# Patient Record
Sex: Female | Born: 1955 | Race: Black or African American | Hispanic: No | Marital: Married | Smoking: Current every day smoker
Health system: Southern US, Community
[De-identification: ages and names within clinical notes are randomized; demographics above are authoritative.]

## PROBLEM LIST (undated history)

## (undated) DIAGNOSIS — N920 Excessive and frequent menstruation with regular cycle: Secondary | ICD-10-CM

## (undated) HISTORY — DX: Excessive and frequent menstruation with regular cycle: N92.0

## (undated) HISTORY — PX: WISDOM TOOTH EXTRACTION: SHX21

---

## 1997-06-15 ENCOUNTER — Ambulatory Visit (HOSPITAL_COMMUNITY): Admission: RE | Admit: 1997-06-15 | Discharge: 1997-06-15 | Payer: Self-pay | Admitting: *Deleted

## 1997-10-29 ENCOUNTER — Inpatient Hospital Stay (HOSPITAL_COMMUNITY): Admission: AD | Admit: 1997-10-29 | Discharge: 1997-10-31 | Payer: Self-pay

## 1997-12-11 ENCOUNTER — Encounter (HOSPITAL_COMMUNITY): Admission: RE | Admit: 1997-12-11 | Discharge: 1998-03-11 | Payer: Self-pay | Admitting: Obstetrics & Gynecology

## 1997-12-21 ENCOUNTER — Encounter: Admission: RE | Admit: 1997-12-21 | Discharge: 1997-12-21 | Payer: Self-pay | Admitting: Obstetrics & Gynecology

## 1997-12-21 ENCOUNTER — Other Ambulatory Visit: Admission: RE | Admit: 1997-12-21 | Discharge: 1997-12-21 | Payer: Self-pay | Admitting: Obstetrics & Gynecology

## 1997-12-21 ENCOUNTER — Ambulatory Visit (HOSPITAL_COMMUNITY): Admission: RE | Admit: 1997-12-21 | Discharge: 1997-12-21 | Payer: Self-pay | Admitting: Obstetrics & Gynecology

## 1998-06-14 ENCOUNTER — Encounter: Admission: RE | Admit: 1998-06-14 | Discharge: 1998-06-14 | Payer: Self-pay | Admitting: Obstetrics & Gynecology

## 1998-09-20 ENCOUNTER — Encounter: Admission: RE | Admit: 1998-09-20 | Discharge: 1998-09-20 | Payer: Self-pay | Admitting: Obstetrics & Gynecology

## 1998-12-20 ENCOUNTER — Encounter: Admission: RE | Admit: 1998-12-20 | Discharge: 1998-12-20 | Payer: Self-pay | Admitting: Obstetrics & Gynecology

## 1999-03-16 ENCOUNTER — Encounter: Admission: RE | Admit: 1999-03-16 | Discharge: 1999-03-16 | Payer: Self-pay | Admitting: Obstetrics

## 1999-12-29 ENCOUNTER — Emergency Department (HOSPITAL_COMMUNITY): Admission: EM | Admit: 1999-12-29 | Discharge: 1999-12-29 | Payer: Self-pay | Admitting: Emergency Medicine

## 2000-12-23 ENCOUNTER — Emergency Department (HOSPITAL_COMMUNITY): Admission: EM | Admit: 2000-12-23 | Discharge: 2000-12-23 | Payer: Self-pay | Admitting: Emergency Medicine

## 2007-02-10 ENCOUNTER — Emergency Department (HOSPITAL_COMMUNITY): Admission: EM | Admit: 2007-02-10 | Discharge: 2007-02-10 | Payer: Self-pay | Admitting: Emergency Medicine

## 2010-04-08 ENCOUNTER — Emergency Department (HOSPITAL_COMMUNITY): Payer: No Typology Code available for payment source

## 2010-04-08 ENCOUNTER — Emergency Department (HOSPITAL_COMMUNITY)
Admission: EM | Admit: 2010-04-08 | Discharge: 2010-04-08 | Disposition: A | Discharge status: Home / Self Care | Payer: No Typology Code available for payment source | Attending: Emergency Medicine | Admitting: Emergency Medicine

## 2010-04-08 DIAGNOSIS — M542 Cervicalgia: Secondary | ICD-10-CM | POA: Insufficient documentation

## 2010-04-08 DIAGNOSIS — R079 Chest pain, unspecified: Secondary | ICD-10-CM | POA: Insufficient documentation

## 2010-04-08 DIAGNOSIS — M79609 Pain in unspecified limb: Secondary | ICD-10-CM | POA: Insufficient documentation

## 2010-04-08 DIAGNOSIS — M549 Dorsalgia, unspecified: Secondary | ICD-10-CM | POA: Insufficient documentation

## 2011-05-01 ENCOUNTER — Inpatient Hospital Stay (HOSPITAL_COMMUNITY)
Admission: AD | Admit: 2011-05-01 | Discharge: 2011-05-01 | Disposition: A | Discharge status: Home / Self Care | Payer: Managed Care, Other (non HMO) | Source: Ambulatory Visit | Attending: Obstetrics & Gynecology | Admitting: Obstetrics & Gynecology

## 2011-05-01 ENCOUNTER — Encounter (HOSPITAL_COMMUNITY): Payer: Self-pay | Admitting: *Deleted

## 2011-05-01 DIAGNOSIS — N938 Other specified abnormal uterine and vaginal bleeding: Secondary | ICD-10-CM | POA: Insufficient documentation

## 2011-05-01 DIAGNOSIS — N921 Excessive and frequent menstruation with irregular cycle: Secondary | ICD-10-CM

## 2011-05-01 DIAGNOSIS — N949 Unspecified condition associated with female genital organs and menstrual cycle: Secondary | ICD-10-CM | POA: Insufficient documentation

## 2011-05-01 DIAGNOSIS — N920 Excessive and frequent menstruation with regular cycle: Secondary | ICD-10-CM

## 2011-05-01 LAB — CBC
MCH: 29.3 pg (ref 26.0–34.0)
Platelets: 240 10*3/uL (ref 150–400)
RBC: 3.82 MIL/uL — ABNORMAL LOW (ref 3.87–5.11)
WBC: 4.7 10*3/uL (ref 4.0–10.5)

## 2011-05-01 LAB — POCT PREGNANCY, URINE: Preg Test, Ur: NEGATIVE

## 2011-05-01 MED ORDER — MEDROXYPROGESTERONE ACETATE 10 MG PO TABS: 10.0000 mg | ORAL_TABLET | Freq: Every day | ORAL | Status: DC

## 2011-05-01 NOTE — Discharge Instructions (Signed)
Abnormal Uterine Bleeding Abnormal uterine bleeding can have many causes. Some cases are simply treated, while others are more serious. There are several kinds of bleeding that is considered abnormal, including:  Bleeding between periods.   Bleeding after sexual intercourse.   Spotting anytime in the menstrual cycle.   Bleeding heavier or more than normal.   Bleeding after menopause.  CAUSES  There are many causes of abnormal uterine bleeding. It can be present in teenagers, pregnant women, women during their reproductive years, and women who have reached menopause. Your caregiver will look for the more common causes depending on your age, signs, symptoms and your particular circumstance. Most cases are not serious and can be treated. Even the more serious causes, like cancer of the female organs, can be treated adequately if found in the early stages. That is why all types of bleeding should be evaluated and treated as soon as possible. DIAGNOSIS  Diagnosing the cause may take several kinds of tests. Your caregiver may:  Take a complete history of the type of bleeding.   Perform a complete physical exam and Pap smear.   Take an ultrasound on the abdomen showing a picture of the female organs and the pelvis.   Inject dye into the uterus and Fallopian tubes and X-ray them (hysterosalpingogram).   Place fluid in the uterus and do an ultrasound (sonohysterogrqphy).   Take a CT scan to examine the female organs and pelvis.   Take an MRI to examine the female organs and pelvis. There is no X-ray involved with this procedure.   Look inside the uterus with a telescope that has a light at the end (hysteroscopy).   Scrap the inside of the uterus to get tissue to examine (Dilatation and Curettage, D&C).   Look into the pelvis with a telescope that has a light at the end (laparoscopy). This is done through a very small cut (incision) in the abdomen.  TREATMENT  Treatment will depend on the  cause of the abnormal bleeding. It can include:  Doing nothing to allow the problem to take care of itself over time.   Hormone treatment.   Birth control pills.   Treating the medical condition causing the problem.   Laparoscopy.   Major or minor surgery   Destroying the lining of the uterus with electrical currant, laser, freezing or heat (uterine ablation).  HOME CARE INSTRUCTIONS   Follow your caregiver's recommendation on how to treat your problem.   See your caregiver if you missed a menstrual period and think you may be pregnant.   If you are bleeding heavily, count the number of pads/tampons you use and how often you have to change them. Tell this to your caregiver.   Avoid sexual intercourse until the problem is controlled.  SEEK MEDICAL CARE IF:   You have any kind of abnormal bleeding mentioned above.   You feel dizzy at times.   You are 56 years old and have not had a menstrual period yet.  SEEK IMMEDIATE MEDICAL CARE IF:   You pass out.   You are changing pads/tampons every 15 to 30 minutes.   You have belly (abdominal) pain.   You have a temperature of 100 F (37.8 C) or higher.   You become sweaty or weak.   You are passing large blood clots from the vagina.   You start to feel sick to your stomach (nauseous) and throw up (vomit).  Document Released: 02/12/2005 Document Revised: 02/01/2011 Document Reviewed: 07/08/2008 [employer]   Patient Information 2012 [employer]. 

## 2011-05-01 NOTE — Progress Notes (Signed)
Pt states, " I have been having vaginal bleeding since 2/23. The first two days were real heavy and now it light and it is not dripping anymore unless I'm up one my feet then it picks up."

## 2011-05-01 NOTE — ED Provider Notes (Signed)
History     Chief Complaint  Patient presents with  . Vaginal Bleeding  . Abdominal Cramping   HPI This is a 56 y.o. black female who presents with complaint of vaginal bleeding since February. At times this has been quite heavy soaking a pad every 30 minutes. No cramping with bleeding. Has not entered menopause yet. Has had regular periods until this year. States her mother and sister also had histories of very heavy bleeding with menses into their 51s. Sister is in her 75s also, still cycling. Pt is sexually active with husband, though not recently. Declines STD testing.   Does not have a GYN doctor now. States had her last baby when she was in her mid38s and asked to have her tubes tied,but the doctor refused.   OB History    Grav Para Term Preterm Abortions TAB SAB Ect Mult Living   4 3 3  0 1 0 1 0 0 3      History reviewed. No pertinent past medical history.  Past Surgical History  Procedure Date  . Vaginal delivery   . Wisdom tooth extraction     Family History  Problem Relation Age of Onset  . Anesthesia problems Neg Hx     History  Substance Use Topics  . Smoking status: Current Everyday Smoker -- 0.2 packs/day  . Smokeless tobacco: Never Used  . Alcohol Use: 4.2 oz/week    7 Cans of beer per week     1 beer per day    Allergies: Allergies not on file  No prescriptions prior to admission    ROS Denies other symptoms  Physical Exam   Blood pressure 130/62, pulse 67, temperature 97.6 F (36.4 C), temperature source Oral, resp. rate 18, height 5' 3.5" (1.613 m), weight 123 lb (55.792 kg), last menstrual period 04/21/2011.  Physical Exam  Constitutional: She is oriented to person, place, and time. She appears well-developed and well-nourished. No distress.  HENT:  Head: Normocephalic.  Cardiovascular: Normal rate.   Respiratory: Effort normal.  GI: Soft. She exhibits no distension and no mass. There is no tenderness. There is no rebound and no guarding.   Genitourinary: Uterus normal. Vaginal discharge (scant brown discharge, no bleeding, cervix long and closed, uterus small, nontender, adnexae nontender.) found.  Musculoskeletal: Normal range of motion.  Neurological: She is alert and oriented to person, place, and time.  Skin: Skin is warm and dry.  Psychiatric: She has a normal mood and affect.    MAU Course  Procedures  Assessment and Plan  A:  Perimenopausal DUB       No acute anemia P:  Will Refer to [facility] for endo bx and management plan       Rx Provera to use if she starts bleeding again      Will get pelvic US this week on Thursday at 10 am         [facility] 05/01/2011, 1:39 PM

## 2011-05-03 ENCOUNTER — Ambulatory Visit (HOSPITAL_COMMUNITY)
Admit: 2011-05-03 | Discharge: 2011-05-03 | Disposition: A | Discharge status: Home / Self Care | Payer: Managed Care, Other (non HMO) | Attending: Advanced Practice Midwife | Admitting: Advanced Practice Midwife

## 2011-05-03 DIAGNOSIS — N921 Excessive and frequent menstruation with irregular cycle: Secondary | ICD-10-CM

## 2011-05-10 ENCOUNTER — Telehealth: Payer: Self-pay | Admitting: *Deleted

## 2011-05-10 NOTE — Telephone Encounter (Signed)
Message copied by Barbara Cower on Thu May 10, 2011  2:36 PM ------      Message from: [place], [place]      Created: Tue May 01, 2011  1:42 PM      Regarding: needs GYN appt for endo bx soon       Saw her in MAU for heavy bleeding.       Her hemoglobin was fine at 11.            But she is 24, having heavy bleeding since Feb. And needs an endometrial biopsy then plan of care            Can we get her in within the next couple of weeks (any doc)?

## 2011-07-30 ENCOUNTER — Ambulatory Visit (INDEPENDENT_AMBULATORY_CARE_PROVIDER_SITE_OTHER): Payer: Managed Care, Other (non HMO) | Admitting: Family Medicine

## 2011-07-30 ENCOUNTER — Encounter: Payer: Self-pay | Admitting: *Deleted

## 2011-07-30 ENCOUNTER — Encounter: Payer: Self-pay | Admitting: Family Medicine

## 2011-07-30 VITALS — BP 136/82 | HR 68 | Temp 97.7°F | Resp 20 | Ht 63.5 in | Wt 122.5 lb

## 2011-07-30 DIAGNOSIS — Z1231 Encounter for screening mammogram for malignant neoplasm of breast: Secondary | ICD-10-CM

## 2011-07-30 DIAGNOSIS — Z8742 Personal history of other diseases of the female genital tract: Secondary | ICD-10-CM | POA: Insufficient documentation

## 2011-07-30 DIAGNOSIS — Z136 Encounter for screening for cardiovascular disorders: Secondary | ICD-10-CM

## 2011-07-30 DIAGNOSIS — Z0000 Encounter for general adult medical examination without abnormal findings: Secondary | ICD-10-CM

## 2011-07-30 DIAGNOSIS — Z1211 Encounter for screening for malignant neoplasm of colon: Secondary | ICD-10-CM

## 2011-07-30 LAB — COMPREHENSIVE METABOLIC PANEL
ALT: 13 U/L (ref 0–35)
Albumin: 3.7 g/dL (ref 3.5–5.2)
CO2: 29 mEq/L (ref 19–32)
Calcium: 8.9 mg/dL (ref 8.4–10.5)
Chloride: 106 mEq/L (ref 96–112)
Creatinine, Ser: 0.7 mg/dL (ref 0.4–1.2)
GFR: 115.12 mL/min (ref >60.00)
Sodium: 141 mEq/L (ref 135–145)
Total Protein: 7 g/dL (ref 6.0–8.3)

## 2011-07-30 LAB — CBC WITH DIFFERENTIAL/PLATELET
Basophils Absolute: 0 10*3/uL (ref 0.0–0.1)
Hemoglobin: 12.7 g/dL (ref 12.0–15.0)
Lymphocytes Relative: 28.8 % (ref 12.0–46.0)
Monocytes Relative: 7.7 % (ref 3.0–12.0)
Neutro Abs: 2.9 10*3/uL (ref 1.4–7.7)
RDW: 13.4 % (ref 11.5–14.6)

## 2011-07-30 NOTE — Patient Instructions (Signed)
It was such a pleasure meeting you today. After you go to the lab, please stop by to see Shirlee Limerick (up front) on your way out. I will call you with your lab results.

## 2011-07-30 NOTE — Progress Notes (Signed)
Subjective:    Patient ID: Alicia Leonard, female    DOB: 1955/10/30, 56 y.o.   MRN: [id number]  HPI  Very pleasant 56 yo here to establish care.  Has never had a regular doctor. Does not take any medications, very active. Has never had a mammogram or colonoscopy. No family h/o any type of cancer that she is aware of.  [facility]. She still has regular periods but this was bleeding that was abnormally heavy for her. Given provera, bleeding stopped.  Has not had any further episodes. Per pt, was told everything was benign- I see ultrasound in Epic that showed small fibroid tumors. I do NOT see endometrial biopsy results. Pt thought one was done along with pap smear- none of these results are listed in Epic.  Lab Results  Component Value Date   WBC 4.7 05/01/2011   HGB 11.2* 05/01/2011   HCT 34.0* 05/01/2011   MCV 89.0 05/01/2011   PLT 240 05/01/2011   Denies fatigue.  Patient Active Problem List  Diagnoses  . Routine general medical examination at a health care facility  . Hx of menorrhagia   Past Medical History  Diagnosis Date  . Heavy menses     started age 56   Past Surgical History  Procedure Date  . Vaginal delivery   . Wisdom tooth extraction    History  Substance Use Topics  . Smoking status: Current Everyday Smoker -- 0.2 packs/day  . Smokeless tobacco: Never Used  . Alcohol Use: 4.2 oz/week    7 Cans of beer per week     1 beer per day   Family History  Problem Relation Age of Onset  . Anesthesia problems Neg Hx   . Hypertension Mother   . Stroke Father   . Hypertension Father   . Diabetes Father   . Hypertension Sister   . Hypertension Brother   . Diabetes Brother   . Hypertension Sister   . Diabetes Sister   . Hypertension Brother   . Diabetes Brother    No Known Allergies Current Outpatient Prescriptions on File Prior to Visit  Medication Sig Dispense Refill  . medroxyPROGESTERone (PROVERA) 10 MG  tablet Take 1 tablet (10 mg total) by mouth daily.  10 tablet  0   The PMH, PSH, Social History, Family History, Medications, and allergies have been reviewed in [facility], and have been updated if relevant.   Review of Systems    See HPI Patient reports no  vision/ hearing changes,anorexia, weight change, fever ,adenopathy, persistant / recurrent hoarseness, swallowing issues, chest pain, edema,persistant / recurrent cough, hemoptysis, dyspnea(rest, exertional, paroxysmal nocturnal), gastrointestinal  bleeding (melena, rectal bleeding), abdominal pain, excessive heart burn, GU symptoms(dysuria, hematuria, pyuria, voiding/incontinence  Issues) syncope, focal weakness, severe memory loss, concerning skin lesions, depression, anxiety, abnormal bruising/bleeding, major joint swelling, or breast masses.  Objective:   Physical Exam BP 136/82  Pulse 68  Temp(Src) 97.7 F (36.5 C) (Oral)  Resp 20  Ht 5' 3.5" (1.613 m)  Wt 122 lb 8 oz (55.566 kg)  BMI 21.36 kg/m2  LMP 07/25/2011  General:  Well-developed,well-nourished,in no acute distress; alert,appropriate and cooperative throughout examination Head:  normocephalic and atraumatic.   Eyes:  vision grossly intact, pupils equal, pupils round, and pupils reactive to light.   Ears:  R ear normal and L ear normal.   Nose:  no external deformity.   Mouth:  good dentition.   Neck:  No deformities,  masses, or tenderness noted. Lungs:  Normal respiratory effort, chest expands symmetrically. Lungs are clear to auscultation, no crackles or wheezes. Heart:  Normal rate and regular rhythm. S1 and S2 normal without gallop, murmur, click, rub or other extra sounds. Abdomen:  Bowel sounds positive,abdomen soft and non-tender without masses, organomegaly or hernias noted. Msk:  No deformity or scoliosis noted of thoracic or lumbar spine.   Extremities:  No clubbing, cyanosis, edema, or deformity noted with normal full range of motion of all joints.   Neurologic:   alert & oriented X3 and gait normal.   Skin:  Intact without suspicious lesions or rashes Cervical Nodes:  No lymphadenopathy noted Axillary Nodes:  No palpable lymphadenopathy Psych:  Cognition and judgment appear intact. Alert and cooperative with normal attention span and concentration. No apparent delusions, illusions, hallucinations     Assessment & Plan:   1. Routine general medical examination at a health care facility   Reviewed preventive care protocols, scheduled due services, and updated immunizations Discussed nutrition, exercise, diet, and healthy lifestyle.  CBC with Differential, Comprehensive metabolic pan MM Digital Screening  2. Hx of menorrhagia  Improved- most likely due to fibroids which pt was unaware of. Given her age, although she is NOT post menopausal, I would feel more comfortable if she had further work up- i.e endometrial biopsy.  She also does need Pap smear, which I am happy to perform, but I did not realize she needed this until after her appointment while I was reviewing notes in Epic. Will refer to GYN.  Ambulatory referral to Gynecology  3. Screening for colon cancer  Ambulatory referral to Gastroenterology  4. Screening for ischemic heart disease  Lipid Panel

## 2011-08-22 ENCOUNTER — Ambulatory Visit (HOSPITAL_COMMUNITY): Payer: Managed Care, Other (non HMO)

## 2011-09-03 ENCOUNTER — Encounter: Payer: Managed Care, Other (non HMO) | Admitting: Gastroenterology

## 2011-09-19 ENCOUNTER — Encounter: Payer: Managed Care, Other (non HMO) | Admitting: Gastroenterology

## 2011-10-04 ENCOUNTER — Ambulatory Visit (HOSPITAL_COMMUNITY): Payer: Managed Care, Other (non HMO)

## 2011-10-08 ENCOUNTER — Encounter: Payer: Self-pay | Admitting: Gastroenterology

## 2011-11-08 ENCOUNTER — Ambulatory Visit (HOSPITAL_COMMUNITY): Payer: Managed Care, Other (non HMO)

## 2011-12-06 ENCOUNTER — Encounter: Payer: Managed Care, Other (non HMO) | Admitting: Gastroenterology

## 2013-12-28 ENCOUNTER — Encounter: Payer: Self-pay | Admitting: Family Medicine

## 2014-02-09 ENCOUNTER — Encounter: Payer: Self-pay | Admitting: Family Medicine

## 2014-02-09 ENCOUNTER — Other Ambulatory Visit (HOSPITAL_COMMUNITY)
Admission: RE | Admit: 2014-02-09 | Discharge: 2014-02-09 | Disposition: A | Discharge status: Home / Self Care | Payer: Managed Care, Other (non HMO) | Source: Ambulatory Visit | Attending: Family Medicine | Admitting: Family Medicine

## 2014-02-09 ENCOUNTER — Ambulatory Visit (INDEPENDENT_AMBULATORY_CARE_PROVIDER_SITE_OTHER): Payer: Managed Care, Other (non HMO) | Admitting: Family Medicine

## 2014-02-09 VITALS — BP 154/94 | HR 65 | Temp 97.7°F | Ht 63.75 in | Wt 121.5 lb

## 2014-02-09 DIAGNOSIS — Z113 Encounter for screening for infections with a predominantly sexual mode of transmission: Secondary | ICD-10-CM | POA: Insufficient documentation

## 2014-02-09 DIAGNOSIS — Z01419 Encounter for gynecological examination (general) (routine) without abnormal findings: Secondary | ICD-10-CM | POA: Diagnosis present

## 2014-02-09 DIAGNOSIS — N760 Acute vaginitis: Secondary | ICD-10-CM | POA: Insufficient documentation

## 2014-02-09 DIAGNOSIS — IMO0001 Reserved for inherently not codable concepts without codable children: Secondary | ICD-10-CM | POA: Insufficient documentation

## 2014-02-09 DIAGNOSIS — Z1239 Encounter for other screening for malignant neoplasm of breast: Secondary | ICD-10-CM

## 2014-02-09 DIAGNOSIS — Z0000 Encounter for general adult medical examination without abnormal findings: Secondary | ICD-10-CM

## 2014-02-09 DIAGNOSIS — Z1211 Encounter for screening for malignant neoplasm of colon: Secondary | ICD-10-CM

## 2014-02-09 DIAGNOSIS — Z8742 Personal history of other diseases of the female genital tract: Secondary | ICD-10-CM

## 2014-02-09 DIAGNOSIS — Z1151 Encounter for screening for human papillomavirus (HPV): Secondary | ICD-10-CM | POA: Diagnosis present

## 2014-02-09 DIAGNOSIS — R030 Elevated blood-pressure reading, without diagnosis of hypertension: Secondary | ICD-10-CM

## 2014-02-09 LAB — COMPREHENSIVE METABOLIC PANEL
ALT: 24 U/L (ref 0–35)
AST: 28 U/L (ref 0–37)
Albumin: 3.8 g/dL (ref 3.5–5.2)
Alkaline Phosphatase: 66 U/L (ref 39–117)
BUN: 12 mg/dL (ref 6–23)
CALCIUM: 8.9 mg/dL (ref 8.4–10.5)
CHLORIDE: 106 meq/L (ref 96–112)
CO2: 27 mEq/L (ref 19–32)
CREATININE: 0.7 mg/dL (ref 0.4–1.2)
GFR: 114.09 mL/min (ref >60.00)
Glucose, Bld: 100 mg/dL — ABNORMAL HIGH (ref 70–99)
Potassium: 3.8 mEq/L (ref 3.5–5.1)
SODIUM: 138 meq/L (ref 135–145)
TOTAL PROTEIN: 7 g/dL (ref 6.0–8.3)
Total Bilirubin: 0.7 mg/dL (ref 0.2–1.2)

## 2014-02-09 LAB — LIPID PANEL
CHOL/HDL RATIO: 3
Cholesterol: 171 mg/dL (ref 0–200)
HDL: 66.9 mg/dL (ref >39.00)
LDL Cholesterol: 88 mg/dL (ref 0–99)
NONHDL: 104.1
Triglycerides: 81 mg/dL (ref 0.0–149.0)
VLDL: 16.2 mg/dL (ref 0.0–40.0)

## 2014-02-09 LAB — CBC WITH DIFFERENTIAL/PLATELET
BASOS ABS: 0 10*3/uL (ref 0.0–0.1)
Basophils Relative: 0.7 % (ref 0.0–3.0)
EOS ABS: 0.2 10*3/uL (ref 0.0–0.7)
Eosinophils Relative: 3 % (ref 0.0–5.0)
HCT: 38.1 % (ref 36.0–46.0)
HEMOGLOBIN: 12.4 g/dL (ref 12.0–15.0)
LYMPHS PCT: 39.2 % (ref 12.0–46.0)
Lymphs Abs: 2.1 10*3/uL (ref 0.7–4.0)
MCHC: 32.7 g/dL (ref 30.0–36.0)
MCV: 90.2 fl (ref 78.0–100.0)
MONOS PCT: 9 % (ref 3.0–12.0)
Monocytes Absolute: 0.5 10*3/uL (ref 0.1–1.0)
NEUTROS ABS: 2.6 10*3/uL (ref 1.4–7.7)
NEUTROS PCT: 48.1 % (ref 43.0–77.0)
PLATELETS: 208 10*3/uL (ref 150.0–400.0)
RBC: 4.22 Mil/uL (ref 3.87–5.11)
RDW: 13.8 % (ref 11.5–15.5)
WBC: 5.4 10*3/uL (ref 4.0–10.5)

## 2014-02-09 LAB — TSH: TSH: 1.19 u[IU]/mL (ref 0.35–4.50)

## 2014-02-09 NOTE — Patient Instructions (Addendum)
Good to see you. Happy Holidays. Please stop by to see Shirlee LimerickMarion on your way out after you go to the lab today.  Please come see me in 2 weeks to recheck your blood pressure.

## 2014-02-09 NOTE — Assessment & Plan Note (Signed)
New- asymptomatic and has had dietary changes and decreased sleep. Will advise her to come back in 2 weeks to recheck, sooner if she does develop symptoms. Does not have a BP cuff at home.

## 2014-02-09 NOTE — Progress Notes (Signed)
Subjective:    Patient ID: Alicia Leonard, female    DOB: 1955-06-18, 58 y.o.   MRN: [id number]  HPI  Very pleasant 58 yo female whom I have not seen since she established care with me in 07/2011.  At last office visit, I referred her for mammogram and colonoscopy- has never had either procedure and she did not keep those appointments because she "had a lot going on."  Menorrhagia- when I saw her in 2013, I referred her to GYN given concerning symptoms for further work up-ie EMB. She did not keep that appointment either. She has still been having irregular periods- did not have a period from 03/2013 until 08/2013 when her brother died. Has not had any additional bleeding or risk.  + hot flashes, no difficulty sleeping.  Elevated blood pressure- BP elevated today.  She denies h/o HTN or HA, blurred vision, CP or SOB. Does have family h/o HTN. Has been going to more holiday parties and may be eating more foods higher in sodium. She is also tired- working a lot of hours recently. BP Readings from Last 3 Encounters:  02/09/14 154/94  07/30/11 136/82  05/01/11 137/69    Lab Results  Component Value Date   WBC 4.7 07/30/2011   HGB 12.7 07/30/2011   HCT 38.8 07/30/2011   MCV 91.7 07/30/2011   PLT 243.0 07/30/2011     Patient Active Problem List   Diagnosis Date Noted  . Routine general medical examination at a health care facility 07/30/2011  . Hx of menorrhagia 07/30/2011   Past Medical History  Diagnosis Date  . Heavy menses     started age 58   Past Surgical History  Procedure Laterality Date  . Vaginal delivery    . Wisdom tooth extraction     History  Substance Use Topics  . Smoking status: Current Every Day Smoker -- 0.25 packs/day  . Smokeless tobacco: Never Used  . Alcohol Use: 4.2 oz/week    7 Cans of beer per week     Comment: 1 beer per day   Family History  Problem Relation Age of Onset  . Anesthesia problems Neg Hx   . Hypertension Mother   .  Stroke Father   . Hypertension Father   . Diabetes Father   . Hypertension Sister   . Hypertension Brother   . Diabetes Brother   . Hypertension Sister   . Diabetes Sister   . Hypertension Brother   . Diabetes Brother    No Known Allergies No current outpatient prescriptions on file prior to visit.   No current facility-administered medications on file prior to visit.   The PMH, PSH, Social History, Family History, Medications, and allergies have been reviewed in [facility], and have been updated if relevant.   Review of Systems  Constitutional: Negative.   HENT: Negative.   Eyes: Negative.   Respiratory: Negative.   Cardiovascular: Negative.   Gastrointestinal: Negative.   Endocrine: Negative.   Genitourinary: Positive for menstrual problem.  Musculoskeletal: Negative.   Skin: Negative.   Neurological: Negative.   Hematological: Negative.   Psychiatric/Behavioral: Negative.   All other systems reviewed and are negative.       Objective:   Physical Exam BP 154/94 mmHg  Pulse 65  Temp(Src) 97.7 F (36.5 C) (Oral)  Ht 5' 3.75" (1.619 m)  Wt 121 lb 8 oz (55.112 kg)  BMI 21.03 kg/m2  SpO2 99%   General:  Well-developed,well-nourished,in no acute distress; alert,appropriate and cooperative throughout  examination Head:  normocephalic and atraumatic.   Eyes:  vision grossly intact, pupils equal, pupils round, and pupils reactive to light.   Ears:  R ear normal and L ear normal.   Nose:  no external deformity.   Mouth:  good dentition.   Neck:  No deformities, masses, or tenderness noted. Breasts:  No mass, nodules, thickening, tenderness, bulging, retraction, inflamation, nipple discharge or skin changes noted.   Lungs:  Normal respiratory effort, chest expands symmetrically. Lungs are clear to auscultation, no crackles or wheezes. Heart:  Normal rate and regular rhythm. S1 and S2 normal without gallop, murmur, click, rub or other extra sounds. Abdomen:  Bowel sounds  positive,abdomen soft and non-tender without masses, organomegaly or hernias noted. Rectal:  no external abnormalities.   Genitalia:  Pelvic Exam:        External: normal female genitalia without lesions or masses        Vagina: normal without lesions or masses        Cervix: normal without lesions or masses        Adnexa: normal bimanual exam without masses or fullness        Uterus: normal by palpation        Pap smear: performed Msk:  No deformity or scoliosis noted of thoracic or lumbar spine.   Extremities:  No clubbing, cyanosis, edema, or deformity noted with normal full range of motion of all joints.   Neurologic:  alert & oriented X3 and gait normal.   Skin:  Intact without suspicious lesions or rashes Cervical Nodes:  No lymphadenopathy noted Axillary Nodes:  No palpable lymphadenopathy Psych:  Cognition and judgment appear intact. Alert and cooperative with normal attention span and concentration. No apparent delusions, illusions, hallucinations

## 2014-02-09 NOTE — Assessment & Plan Note (Signed)
Reviewed preventive care protocols, scheduled due services, and updated immunizations Discussed nutrition, exercise, diet, and healthy lifestyle.  Pap smear today. Mammogram and GI referral ordered again- she states she will keep these appts.  Screening blood work-  Orders Placed This Encounter  Procedures  . MM Digital Screening  . CBC with Differential  . Comprehensive metabolic panel  . Lipid panel  . TSH  . Ambulatory referral to Gastroenterology

## 2014-02-09 NOTE — Progress Notes (Signed)
Pre visit review using our clinic review tool, if applicable. No additional management support is needed unless otherwise documented below in the visit note. 

## 2014-02-09 NOTE — Assessment & Plan Note (Signed)
She declines GYN referral despite my recommendations- she is aware this is AMA.  Certainly could just be symptoms of fibroids with perimenopause but I cannot confirm that without further evaluation.

## 2014-02-10 ENCOUNTER — Telehealth: Payer: Self-pay | Admitting: Family Medicine

## 2014-02-10 ENCOUNTER — Encounter: Payer: Self-pay | Admitting: *Deleted

## 2014-02-10 LAB — CYTOLOGY - PAP

## 2014-02-10 LAB — CERVICOVAGINAL ANCILLARY ONLY: CANDIDA VAGINITIS: NEGATIVE

## 2014-02-10 NOTE — Telephone Encounter (Signed)
emmi mailed  °

## 2014-02-12 LAB — CERVICOVAGINAL ANCILLARY ONLY: Herpes: NEGATIVE

## 2014-03-08 ENCOUNTER — Ambulatory Visit: Payer: Managed Care, Other (non HMO) | Admitting: Family Medicine

## 2014-03-15 ENCOUNTER — Encounter: Payer: Self-pay | Admitting: Internal Medicine

## 2014-04-27 ENCOUNTER — Ambulatory Visit (AMBULATORY_SURGERY_CENTER): Payer: Self-pay | Admitting: *Deleted

## 2014-04-27 VITALS — Ht 63.0 in | Wt 116.0 lb

## 2014-04-27 DIAGNOSIS — Z1211 Encounter for screening for malignant neoplasm of colon: Secondary | ICD-10-CM

## 2014-04-27 NOTE — Progress Notes (Signed)
No egg or soy allergy  Pt has only had sedation with wisdom teeth removal; no trouble moving neck  No diet medications taken  [facility]

## 2014-05-03 ENCOUNTER — Ambulatory Visit: Payer: Managed Care, Other (non HMO)

## 2014-05-11 ENCOUNTER — Encounter: Payer: Managed Care, Other (non HMO) | Admitting: Internal Medicine

## 2014-06-22 ENCOUNTER — Encounter: Payer: Managed Care, Other (non HMO) | Admitting: Internal Medicine

## 2014-09-16 ENCOUNTER — Telehealth: Payer: Self-pay

## 2014-09-16 NOTE — Telephone Encounter (Signed)
Left a message for patient to return my call about having a Mammogram set up. Will await call back.  

## 2015-04-06 ENCOUNTER — Other Ambulatory Visit: Payer: Managed Care, Other (non HMO)

## 2015-04-11 ENCOUNTER — Encounter: Payer: Managed Care, Other (non HMO) | Admitting: Family Medicine

## 2015-04-13 ENCOUNTER — Ambulatory Visit (INDEPENDENT_AMBULATORY_CARE_PROVIDER_SITE_OTHER): Payer: Managed Care, Other (non HMO) | Admitting: Family Medicine

## 2015-04-13 ENCOUNTER — Encounter: Payer: Self-pay | Admitting: Family Medicine

## 2015-04-13 VITALS — BP 148/86 | HR 68 | Temp 97.6°F | Ht 63.25 in | Wt 117.0 lb

## 2015-04-13 DIAGNOSIS — Z0000 Encounter for general adult medical examination without abnormal findings: Secondary | ICD-10-CM | POA: Diagnosis not present

## 2015-04-13 DIAGNOSIS — Z1211 Encounter for screening for malignant neoplasm of colon: Secondary | ICD-10-CM

## 2015-04-13 LAB — CBC WITH DIFFERENTIAL/PLATELET
BASOS ABS: 0 10*3/uL (ref 0.0–0.1)
Basophils Relative: 0.7 % (ref 0.0–3.0)
EOS ABS: 0.2 10*3/uL (ref 0.0–0.7)
Eosinophils Relative: 3.9 % (ref 0.0–5.0)
HCT: 40.1 % (ref 36.0–46.0)
Hemoglobin: 13.3 g/dL (ref 12.0–15.0)
LYMPHS ABS: 1.8 10*3/uL (ref 0.7–4.0)
Lymphocytes Relative: 38.2 % (ref 12.0–46.0)
MCHC: 33.2 g/dL (ref 30.0–36.0)
MCV: 88.8 fl (ref 78.0–100.0)
MONO ABS: 0.4 10*3/uL (ref 0.1–1.0)
Monocytes Relative: 8.3 % (ref 3.0–12.0)
NEUTROS ABS: 2.4 10*3/uL (ref 1.4–7.7)
NEUTROS PCT: 48.9 % (ref 43.0–77.0)
PLATELETS: 247 10*3/uL (ref 150.0–400.0)
RBC: 4.52 Mil/uL (ref 3.87–5.11)
RDW: 13.7 % (ref 11.5–15.5)
WBC: 4.8 10*3/uL (ref 4.0–10.5)

## 2015-04-13 LAB — COMPREHENSIVE METABOLIC PANEL
ALT: 15 U/L (ref 0–35)
AST: 22 U/L (ref 0–37)
Albumin: 4.3 g/dL (ref 3.5–5.2)
Alkaline Phosphatase: 86 U/L (ref 39–117)
BILIRUBIN TOTAL: 0.9 mg/dL (ref 0.2–1.2)
BUN: 14 mg/dL (ref 6–23)
CO2: 30 meq/L (ref 19–32)
Calcium: 9.4 mg/dL (ref 8.4–10.5)
Chloride: 102 mEq/L (ref 96–112)
Creatinine, Ser: 0.72 mg/dL (ref 0.40–1.20)
GFR: 106.38 mL/min (ref >60.00)
GLUCOSE: 104 mg/dL — AB (ref 70–99)
Potassium: 4 mEq/L (ref 3.5–5.1)
SODIUM: 138 meq/L (ref 135–145)
Total Protein: 7.4 g/dL (ref 6.0–8.3)

## 2015-04-13 LAB — LIPID PANEL
CHOL/HDL RATIO: 3
Cholesterol: 201 mg/dL — ABNORMAL HIGH (ref 0–200)
HDL: 73.5 mg/dL (ref >39.00)
LDL CALC: 106 mg/dL — AB (ref 0–99)
NONHDL: 127.12
TRIGLYCERIDES: 108 mg/dL (ref 0.0–149.0)
VLDL: 21.6 mg/dL (ref 0.0–40.0)

## 2015-04-13 LAB — TSH: TSH: 1.34 u[IU]/mL (ref 0.35–4.50)

## 2015-04-13 NOTE — Addendum Note (Signed)
Addended by: Dianne Dun on: 04/13/2015 09:38 AM   Modules accepted: Kipp Brood

## 2015-04-13 NOTE — Addendum Note (Signed)
Addended by: Alvina Chou on: 04/13/2015 11:26 AM   Modules accepted: Orders

## 2015-04-13 NOTE — Patient Instructions (Signed)
Great to see you.  We will call you with your lab results and you can view them online.  Please call to schedule your mammogram.   

## 2015-04-13 NOTE — Assessment & Plan Note (Signed)
Reviewed preventive care protocols, scheduled due services, and updated immunizations Discussed nutrition, exercise, diet, and healthy lifestyle.  Labs today.  Agrees to schedule her mammogram.  Continues to defer colonoscopy. Does agree to IFOB.  Orders Placed This Encounter  Procedures  . Fecal occult blood, imunochemical  . CBC with Differential/Platelet  . Comprehensive metabolic panel  . TSH  . Lipid panel  . Hepatitis C Antibody  . HIV antibody (with reflex)

## 2015-04-13 NOTE — Progress Notes (Signed)
Pre visit review using our clinic review tool, if applicable. No additional management support is needed unless otherwise documented below in the visit note. 

## 2015-04-13 NOTE — Progress Notes (Addendum)
Subjective:    Patient ID: Alicia Leonard, female    DOB: 1955/10/29, 60 y.o.   MRN: [id number]  HPI  Very pleasant 60 yo female here for CPX.  Last pap smear done by me on 02/09/14 neg.  When she established with me, I referred her for mammogram and colonoscopy and again at last office visit- has never had either procedure and she did not keep those appointments because she "had a lot going on."  Has not had a period in 2 years.  No post menopausal bleeding.  + hot flashes, no difficulty sleeping.  Elevated blood pressure- BP elevated today.  She denies h/o HTN or HA, blurred vision, CP or SOB. Does have family h/o HTN. Per pt, h/o white coat hypertension.  BP Readings from Last 3 Encounters:  04/13/15 148/86  02/09/14 154/94  07/30/11 136/82   Doing well at work. Lab Results  Component Value Date   WBC 5.4 02/09/2014   HGB 12.4 02/09/2014   HCT 38.1 02/09/2014   MCV 90.2 02/09/2014   PLT 208.0 02/09/2014     Patient Active Problem List   Diagnosis Date Noted  . Routine general medical examination at a health care facility 07/30/2011   Past Medical History  Diagnosis Date  . Heavy menses     started age 21, had [place] for this   Past Surgical History  Procedure Laterality Date  . Vaginal delivery    . Wisdom tooth extraction     Social History  Substance Use Topics  . Smoking status: Current Every Day Smoker -- 0.25 packs/day  . Smokeless tobacco: Never Used     Comment: 3 cigarettes  . Alcohol Use: 4.2 oz/week    7 Cans of beer per week     Comment: 1 beer per day   Family History  Problem Relation Age of Onset  . Anesthesia problems Neg Hx   . Colon cancer Neg Hx   . Esophageal cancer Neg Hx   . Stomach cancer Neg Hx   . Rectal cancer Neg Hx   . Hypertension Mother   . Stroke Father   . Hypertension Father   . Diabetes Father   . Hypertension Sister   . Hypertension Brother   . Diabetes Brother   . Hypertension Sister   . Diabetes Sister    . Hypertension Brother   . Diabetes Brother    No Known Allergies No current outpatient prescriptions on file prior to visit.   No current facility-administered medications on file prior to visit.   The PMH, PSH, Social History, Family History, Medications, and allergies have been reviewed in [facility], and have been updated if relevant.   Review of Systems  Constitutional: Negative.   HENT: Negative.   Eyes: Negative.   Respiratory: Negative.   Cardiovascular: Negative.   Gastrointestinal: Negative.   Endocrine: Negative.   Genitourinary: Negative for menstrual problem.  Musculoskeletal: Negative.   Skin: Negative.   Neurological: Negative.   Hematological: Negative.   Psychiatric/Behavioral: Negative.   All other systems reviewed and are negative.       Objective:   Physical Exam BP 148/86 mmHg  Pulse 68  Temp(Src) 97.6 F (36.4 C) (Oral)  Ht 5' 3.25" (1.607 m)  Wt 117 lb (53.071 kg)  BMI 20.55 kg/m2  SpO2 99%   General:  Well-developed,well-nourished,in no acute distress; alert,appropriate and cooperative throughout examination Head:  normocephalic and atraumatic.   Eyes:  vision grossly intact, pupils equal, pupils round, and pupils  reactive to light.   Ears:  R ear normal and L ear normal.   Nose:  no external deformity.   Mouth:  good dentition.   Neck:  No deformities, masses, or tenderness noted. Breasts:  No mass, nodules, thickening, tenderness, bulging, retraction, inflamation, nipple discharge or skin changes noted.   Lungs:  Normal respiratory effort, chest expands symmetrically. Lungs are clear to auscultation, no crackles or wheezes. Heart:  Normal rate and regular rhythm. S1 and S2 normal without gallop, murmur, click, rub or other extra sounds. Abdomen:  Bowel sounds positive,abdomen soft and non-tender without masses, organomegaly or hernias noted. Msk:  No deformity or scoliosis noted of thoracic or lumbar spine.   Extremities:  No clubbing,  cyanosis, edema, or deformity noted with normal full range of motion of all joints.   Neurologic:  alert & oriented X3 and gait normal.   Skin:  Intact without suspicious lesions or rashes Cervical Nodes:  No lymphadenopathy noted Axillary Nodes:  No palpable lymphadenopathy Psych:  Cognition and judgment appear intact. Alert and cooperative with normal attention span and concentration. No apparent delusions, illusions, hallucinations

## 2015-04-14 ENCOUNTER — Encounter: Payer: Self-pay | Admitting: *Deleted

## 2015-04-14 LAB — HIV ANTIBODY (ROUTINE TESTING W REFLEX): HIV: NONREACTIVE

## 2015-04-14 LAB — HEPATITIS C ANTIBODY: HCV Ab: NEGATIVE

## 2015-06-02 ENCOUNTER — Encounter (HOSPITAL_COMMUNITY): Payer: Self-pay | Admitting: Emergency Medicine

## 2015-06-02 ENCOUNTER — Emergency Department (HOSPITAL_COMMUNITY): Payer: Worker's Compensation

## 2015-06-02 ENCOUNTER — Emergency Department (HOSPITAL_COMMUNITY)
Admission: EM | Admit: 2015-06-02 | Discharge: 2015-06-02 | Disposition: A | Discharge status: Home / Self Care | Payer: Worker's Compensation | Attending: Emergency Medicine | Admitting: Emergency Medicine

## 2015-06-02 DIAGNOSIS — Y9389 Activity, other specified: Secondary | ICD-10-CM | POA: Diagnosis not present

## 2015-06-02 DIAGNOSIS — Y990 Civilian activity done for income or pay: Secondary | ICD-10-CM | POA: Insufficient documentation

## 2015-06-02 DIAGNOSIS — S61312A Laceration without foreign body of right middle finger with damage to nail, initial encounter: Secondary | ICD-10-CM | POA: Diagnosis not present

## 2015-06-02 DIAGNOSIS — S62632B Displaced fracture of distal phalanx of right middle finger, initial encounter for open fracture: Secondary | ICD-10-CM | POA: Diagnosis not present

## 2015-06-02 DIAGNOSIS — S61302A Unspecified open wound of right middle finger with damage to nail, initial encounter: Secondary | ICD-10-CM | POA: Insufficient documentation

## 2015-06-02 DIAGNOSIS — Y9289 Other specified places as the place of occurrence of the external cause: Secondary | ICD-10-CM | POA: Diagnosis not present

## 2015-06-02 DIAGNOSIS — S62609B Fracture of unspecified phalanx of unspecified finger, initial encounter for open fracture: Secondary | ICD-10-CM

## 2015-06-02 DIAGNOSIS — S6992XA Unspecified injury of left wrist, hand and finger(s), initial encounter: Secondary | ICD-10-CM | POA: Diagnosis present

## 2015-06-02 DIAGNOSIS — F17200 Nicotine dependence, unspecified, uncomplicated: Secondary | ICD-10-CM | POA: Diagnosis not present

## 2015-06-02 DIAGNOSIS — W231XXA Caught, crushed, jammed, or pinched between stationary objects, initial encounter: Secondary | ICD-10-CM | POA: Diagnosis not present

## 2015-06-02 DIAGNOSIS — S60131A Contusion of right middle finger with damage to nail, initial encounter: Secondary | ICD-10-CM | POA: Diagnosis not present

## 2015-06-02 MED ORDER — ONDANSETRON HCL 4 MG/2ML IJ SOLN: 4.0000 mg | Freq: Once | INTRAMUSCULAR | Status: DC

## 2015-06-02 MED ORDER — HYDROMORPHONE HCL 1 MG/ML IJ SOLN: 1.0000 mg | Freq: Once | INTRAMUSCULAR | Status: DC

## 2015-06-02 MED ORDER — CEPHALEXIN 500 MG PO CAPS: 500.0000 mg | ORAL_CAPSULE | Freq: Four times a day (QID) | ORAL | Status: DC

## 2015-06-02 MED ORDER — LIDOCAINE HCL (PF) 1 % IJ SOLN
5.0000 mL | Freq: Once | INTRAMUSCULAR | Status: AC
  Administered 2015-06-02: 5 mL
  Filled 2015-06-02: qty 5

## 2015-06-02 MED ORDER — HYDROCODONE-ACETAMINOPHEN 5-325 MG PO TABS: 1.0000 | ORAL_TABLET | Freq: Four times a day (QID) | ORAL | Status: DC | PRN

## 2015-06-02 MED ORDER — CEPHALEXIN 250 MG PO CAPS
500.0000 mg | ORAL_CAPSULE | Freq: Once | ORAL | Status: AC
  Administered 2015-06-02: 500 mg via ORAL
  Filled 2015-06-02: qty 2

## 2015-06-02 NOTE — ED Provider Notes (Signed)
LACERATION REPAIR Performed by: Anselmo RodSamantha N Amry Cathy Authorized by: Anselmo RodSamantha N Eman Rynders Consent: Verbal consent obtained. Risks and benefits: risks, benefits and alternatives were discussed Consent given by: patient Patient identity confirmed: provided demographic data Prepped and Draped in normal sterile fashion Wound explored  Laceration Location: distal right middle finger  Laceration Length: 1.5 cm  No Foreign Bodies seen or palpated  Anesthesia: local infiltration  Local anesthetic: (performed by Kerrie BuffaloHope Neese, NP) lidocaine 1% without epinephrine Anesthetic total: 5 ml  Irrigation method: syringe Amount of cleaning: 0.6 L NS pressure irrigation with 60 cc syringe and splashguard  Skin closure: 5-0 prolene  Number of sutures: 4  Technique: simple interrupted  Patient tolerance: Patient tolerated the procedure well with no immediate complications.   Melton KrebsSamantha Nicole Raahim Shartzer, PA-C 06/02/15 2203  Pricilla LovelessScott Goldston, MD 06/03/15 [phone]

## 2015-06-02 NOTE — ED Notes (Signed)
Pt states she got her right finger stuck in printer machine at work. Bleeding is controlled with bandage.

## 2015-06-02 NOTE — ED Provider Notes (Signed)
CSN: [id number]     Arrival date & time 06/02/15  1853 History  By signing my name below, I, Lorenza Chick, attest that this documentation has been prepared under the direction and in the presence of Kerrie Buffalo, NP. Electronically Signed: Lorenza Chick, ED Scribe. 06/02/2015. 8:22 PM.    Chief Complaint  Patient presents with  . Finger Injury   The history is provided by the patient. No language interpreter was used.    HPI Comments: GENEVIE ELMAN is a 60 y.o. female who presents to the Emergency Department complaining of finger injury to right middle finger that occurred earlier today. Pt states that she was at work today and got her finger stuck in a printer, crushing it. She notes sudden onset, constant, pain to the finger after crushing it. Denies weakness, numbness, tingling, or any other associated symptoms. Tetanus up to date.    Past Medical History  Diagnosis Date  . Heavy menses     started age 51, had [place] for this   Past Surgical History  Procedure Laterality Date  . Vaginal delivery    . Wisdom tooth extraction     Family History  Problem Relation Age of Onset  . Anesthesia problems Neg Hx   . Colon cancer Neg Hx   . Esophageal cancer Neg Hx   . Stomach cancer Neg Hx   . Rectal cancer Neg Hx   . Hypertension Mother   . Stroke Father   . Hypertension Father   . Diabetes Father   . Hypertension Sister   . Hypertension Brother   . Diabetes Brother   . Hypertension Sister   . Diabetes Sister   . Hypertension Brother   . Diabetes Brother    Social History  Substance Use Topics  . Smoking status: Current Every Day Smoker -- 0.25 packs/day  . Smokeless tobacco: Never Used     Comment: 3 cigarettes  . Alcohol Use: 4.2 oz/week    7 Cans of beer per week     Comment: 1 beer per day   OB History    Gravida Para Term Preterm AB TAB SAB Ectopic Multiple Living   0 1 0 1 0 0 3     Review of Systems  Musculoskeletal: Positive for arthralgias (right middle  finger).  Neurological: Negative for weakness and numbness.  All other systems reviewed and are negative.   Allergies  Review of patient's allergies indicates no known allergies.  Home Medications   Prior to Admission medications   Medication Sig Start Date End Date Taking? Authorizing Provider  cephALEXin (KEFLEX) 500 MG capsule Take 1 capsule (500 mg total) by mouth 4 (four) times daily. 06/02/15   Hope Orlene Och, NP  HYDROcodone-acetaminophen (NORCO) 5-325 MG tablet Take 1 tablet by mouth every 6 (six) hours as needed for moderate pain. 06/02/15   Hope Orlene Och, NP   BP 152/89 mmHg  Pulse 76  Temp(Src) 97.4 F (36.3 C) (Oral)  Resp 16  Ht  (1.6 m)  Wt 56.926 kg  BMI 22.24 kg/m2  SpO2 100%   Physical Exam  Constitutional: She is oriented to person, place, and time. She appears well-developed and well-nourished. No distress.  HENT:  Head: Normocephalic and atraumatic.  Eyes: Conjunctivae and EOM are normal.  Neck: Neck supple. No tracheal deviation present.  Cardiovascular: Normal rate.   Pulmonary/Chest: Effort normal and breath sounds normal.  Abdominal: Soft. There is no tenderness. There is no rebound, no  guarding and no CVA tenderness.  Musculoskeletal: Normal range of motion.  Open wound to the tip of the right middle finger and subungual hematoma. Tender on exam. Patient has good range of motion  Neurological: She is alert and oriented to person, place, and time.  Skin: Skin is warm and dry.  1.5 cm laceration to tip of right middle finger  Psychiatric: She has a normal mood and affect. Her behavior is normal.  Nursing note and vitals reviewed.   ED Course  Procedures (including critical care time)   Anesthetic used (type and amt): 1.5CCs 1% Lidocaine and 1.5 CCs Sensorcaine for digital block Wound soaked in NSS with peroxide Irrigated with NSS  Laceration repair by Lane HackerNicole Riley, PA-C (see her procedure note)  DIAGNOSTIC STUDIES: Oxygen Saturation is 100%  on RA, normal by my interpretation.    COORDINATION OF CARE: 8:22 PM-Discussed treatment plan which includes DG imaging of finger, Xylocaine,   Labs Review Labs Reviewed - No data to display  Imaging Review Dg Finger Middle Right  06/02/2015  CLINICAL DATA:  Right middle finger injury earlier today. crush injury EXAM: RIGHT MIDDLE FINGER 2+V COMPARISON:  None. FINDINGS: Soft tissue defect over the tip of the third finger. Comminuted fracture involving the tuft of the distal phalanx. Fracture fragment is moderately displaced distally. There is mild comminution. IMPRESSION: Fracture distal phalanx third finger Electronically Signed   By: Esperanza Heiraymond  Rubner M.D.   On: 06/02/2015 21:17   I have personally reviewed and evaluated these images and lab results as part of my medical decision-making.  Splint applied, Keflex 500 mg prior to d/c.  Rx Keflex, Hydrocodone  MDM  60 y.o. female with laceration due to crush injury while at work today. Stable for d/c to f/u with Dr. Janee Mornhompson. Dr. Janee Mornhompson came by to see the patient prior to d/c   Final diagnoses:  Open fracture of phalanx of finger of left hand, initial encounter   I personally performed the services described in this documentation, which was scribed in my presence. The recorded information has been reviewed and is accurate.    [address]Hope Liberty CenterM Neese, [place] 06/02/15 2240  Nelva Nayobert Beaton, MD 06/02/15 2352

## 2015-06-02 NOTE — Discharge Instructions (Signed)
You may take advil in addition to the medication we give your. Elevate the hand to help reduce pain and swelling. Dr. Carollee Massedhompson's office will call you to schedule follow up.  Do not drive while taking the narcotic pain medication.

## 2015-06-06 ENCOUNTER — Telehealth: Payer: Self-pay | Admitting: Family Medicine

## 2015-06-06 NOTE — Telephone Encounter (Signed)
Pt has appt with Dr Dayton MartesAron on 04/11/17at 9 AM.

## 2015-06-06 NOTE — Telephone Encounter (Signed)
[facility] - Client TELEPHONE ADVICE RECORD [facility]  Patient Name: Alicia Leonard  DOB: 1955/03/03    Initial Comment Caller states had accident on job; wants her to f/u; finger was almost cut off; [facility] MD (for the job injury) adv to see pcp re BP; BP was way too high; 180/96; before was higher;    Nurse Assessment  Nurse: Dorthula RuePatten, RN, Enrique SackKendra Date/Time (Eastern Time): 06/06/2015 2:34:13 PM  Confirm and document reason for call. If symptomatic, describe symptoms. You must click the next button to save text entered. ---Caller states her BP was to high after she saw the MD at [facility] due to finger injury. She says [facility] will follow up on her finger. She states her BP was 180/96  Has the patient traveled out of the country within the last 30 days? ---Not Applicable  Does the patient have any new or worsening symptoms? ---Yes  Will a triage be completed? ---Yes  Related visit to physician within the last 2 weeks? ---No  Does the PT have any chronic conditions? (i.e. diabetes, asthma, etc.) ---No  Is this a behavioral health or substance abuse call? ---No     Guidelines    Guideline Title Affirmed Question Affirmed Notes  High Blood Pressure BP ? 160/100    Final Disposition User   See PCP When Office is Open (within 3 days) Dorthula RuePatten, RN, Enrique SackKendra    Comments  Scheduled with Dr Dayton MartesAron at 900am on 04/11.17   Referrals  REFERRED TO PCP OFFICE   Disagree/Comply: Comply

## 2015-06-07 ENCOUNTER — Encounter: Payer: Self-pay | Admitting: Family Medicine

## 2015-06-07 ENCOUNTER — Ambulatory Visit (INDEPENDENT_AMBULATORY_CARE_PROVIDER_SITE_OTHER): Payer: Managed Care, Other (non HMO) | Admitting: Family Medicine

## 2015-06-07 VITALS — BP 158/90 | HR 84 | Temp 97.9°F | Wt 123.5 lb

## 2015-06-07 DIAGNOSIS — S6992XS Unspecified injury of left wrist, hand and finger(s), sequela: Secondary | ICD-10-CM | POA: Diagnosis not present

## 2015-06-07 DIAGNOSIS — IMO0001 Reserved for inherently not codable concepts without codable children: Secondary | ICD-10-CM

## 2015-06-07 DIAGNOSIS — R030 Elevated blood-pressure reading, without diagnosis of hypertension: Secondary | ICD-10-CM | POA: Diagnosis not present

## 2015-06-07 DIAGNOSIS — S6990XA Unspecified injury of unspecified wrist, hand and finger(s), initial encounter: Secondary | ICD-10-CM | POA: Insufficient documentation

## 2015-06-07 MED ORDER — HYDROCHLOROTHIAZIDE 12.5 MG PO CAPS: 12.5000 mg | ORAL_CAPSULE | Freq: Every day | ORAL | Status: DC

## 2015-06-07 NOTE — Progress Notes (Signed)
Pre visit review using our clinic review tool, if applicable. No additional management support is needed unless otherwise documented below in the visit note. 

## 2015-06-07 NOTE — Assessment & Plan Note (Signed)
Likely worsened due to acute pain but has had slowly increasing BP readings over past year. She is symptomatic now, will treat with low dose HCTZ- 12.5 mg daily. Follow up in 2 weeks. The patient indicates understanding of these issues and agrees with the plan.

## 2015-06-07 NOTE — Patient Instructions (Signed)
We are starting HCTZ 12.5 mg daily. Please come see me in 2 weeks.

## 2015-06-07 NOTE — Progress Notes (Signed)
Subjective:   Patient ID: Alicia Leonard, female    DOB: 04/16/1955, 60 y.o.   MRN: [id number]  Alicia Leonard is a pleasant 60 y.o. year old female who presents to clinic today with Hypertension  on 06/07/2015  HPI:  Fractured her finger at work on 4/6- ER notes and xrays reviewed. Has appt with ortho tomorrow.  Was seen also by worker's comp who told her she needed to see me due to elevated blood pressure readings.  Was 180s/90s in their office.    She has been having more headaches.  No CP, SOB or blurred vision.  Current Outpatient Prescriptions on File Prior to Visit  Medication Sig Dispense Refill  . cephALEXin (KEFLEX) 500 MG capsule Take 1 capsule (500 mg total) by mouth 4 (four) times daily. 28 capsule 0  . HYDROcodone-acetaminophen (NORCO) 5-325 MG tablet Take 1 tablet by mouth every 6 (six) hours as needed for moderate pain. 20 tablet 0   No current facility-administered medications on file prior to visit.    No Known Allergies  Past Medical History  Diagnosis Date  . Heavy menses     started age 60, had US for this    Past Surgical History  Procedure Laterality Date  . Vaginal delivery    . Wisdom tooth extraction      Family History  Problem Relation Age of Onset  . Anesthesia problems Neg Hx   . Colon cancer Neg Hx   . Esophageal cancer Neg Hx   . Stomach cancer Neg Hx   . Rectal cancer Neg Hx   . Hypertension Mother   . Stroke Father   . Hypertension Father   . Diabetes Father   . Hypertension Sister   . Hypertension Brother   . Diabetes Brother   . Hypertension Sister   . Diabetes Sister   . Hypertension Brother   . Diabetes Brother     Social History   Social History  . Marital Status: Married    Spouse Name: N/A  . Number of Children: N/A  . Years of Education: N/A   Occupational History  . Not on file.   Social History Main Topics  . Smoking status: Current Every Day Smoker -- 0.25 packs/day  . Smokeless tobacco:  Never Used     Comment: 3 cigarettes  . Alcohol Use: 4.2 oz/week    7 Cans of beer per week     Comment: 1 beer per day  . Drug Use: Yes    Special: Marijuana     Comment: 3 X day until 1986  . Sexual Activity: Yes    Birth Control/ Protection: None   Other Topics Concern  . Not on file   Social History Narrative   The PMH, PSH, Social History, Family History, Medications, and allergies [facility], and have been updated if relevant.   Review of Systems  Constitutional: Negative.   Eyes: Negative.   Respiratory: Negative.   Cardiovascular: Negative.   Musculoskeletal: Negative.   Skin: Negative.   Neurological: Positive for light-headedness and headaches. Negative for tremors, seizures, syncope, speech difficulty, weakness and numbness.  All other systems reviewed and are negative.      Objective:    BP 158/90 mmHg  Pulse 84  Temp(Src) 97.9 F (36.6 C) (Oral)  Wt 123 lb 8 oz (56.019 kg)  SpO2 99% BP Readings from Last 3 Encounters:  06/07/15 158/90  06/02/15 152/89  04/13/15 148/86  Physical Exam  Constitutional: She is oriented to person, place, and time. She appears well-developed and well-nourished. No distress.  HENT:  Head: Normocephalic and atraumatic.  Eyes: Conjunctivae are normal.  Cardiovascular: Normal rate and regular rhythm.   Pulmonary/Chest: Effort normal and breath sounds normal.  Musculoskeletal:  Finger wrapped in gauze  Neurological: She is alert and oriented to person, place, and time.  Skin: Skin is warm and dry. She is not diaphoretic.  Psychiatric: She has a normal mood and affect. Her behavior is normal. Judgment and thought content normal.  Nursing note and vitals reviewed.         Assessment & Plan:   Elevated blood pressure No Follow-up on file.

## 2015-06-07 NOTE — Assessment & Plan Note (Signed)
Advised to keep appt with ortho tomorrow.

## 2015-06-21 ENCOUNTER — Ambulatory Visit: Payer: Self-pay | Admitting: Family Medicine

## 2016-11-29 ENCOUNTER — Encounter: Payer: Self-pay | Admitting: Family Medicine

## 2017-06-10 ENCOUNTER — Ambulatory Visit: Payer: Self-pay | Admitting: Family Medicine

## 2017-12-22 IMAGING — CR DG FINGER MIDDLE 2+V*R*
3 series · 3 of 3 positions shown · non-contrast
Comparison: None.

CLINICAL DATA: Right middle finger injury earlier today. crush
injury

EXAM:
RIGHT MIDDLE FINGER 2+V

[finger ap]
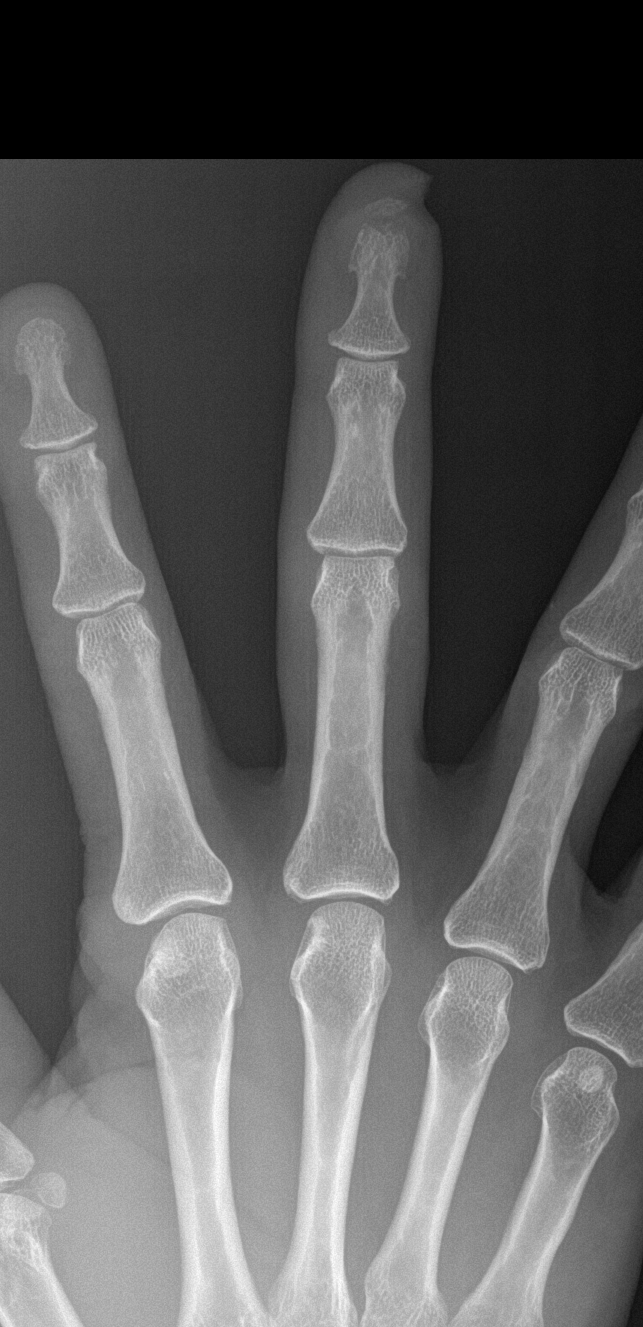

[finger obl]
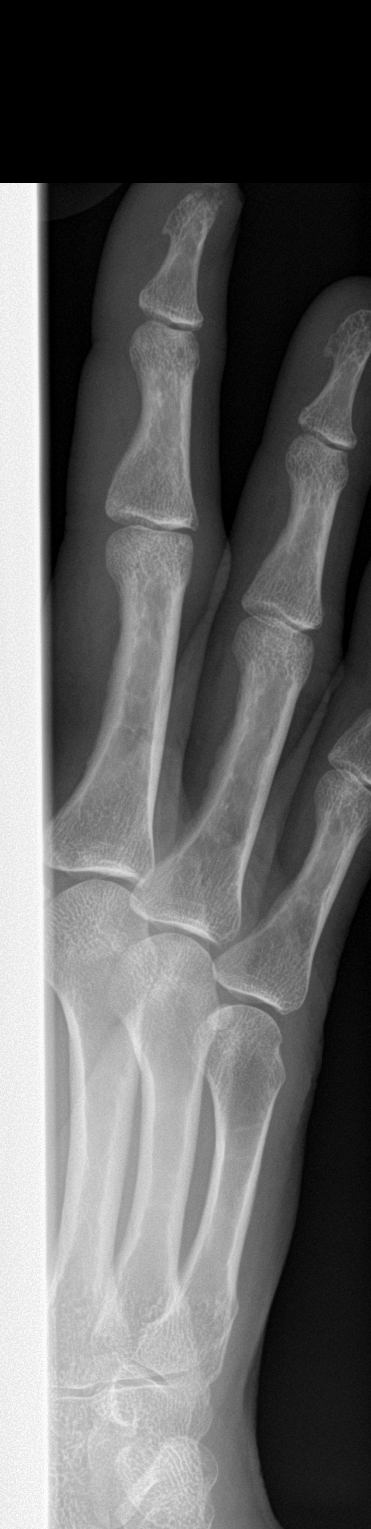

[finger lat]
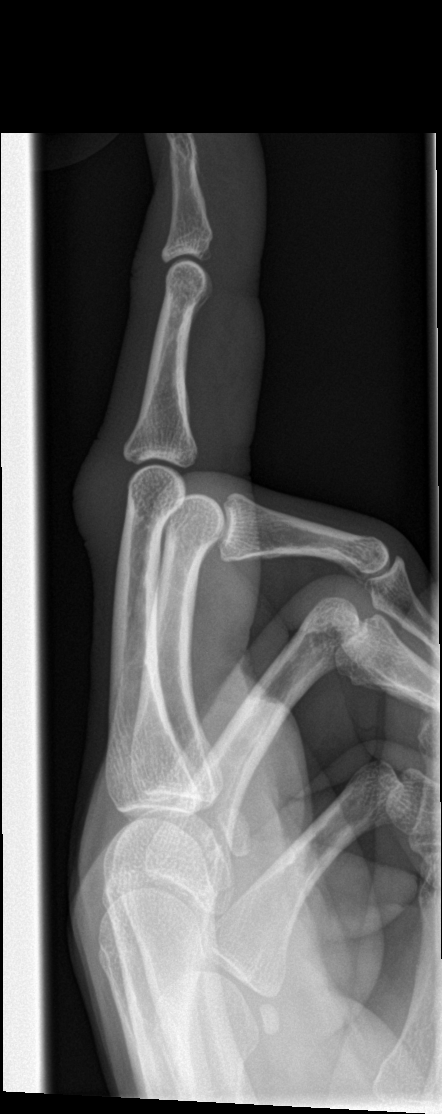

[3 of 3 positions shown; findings below may reference images not displayed]

FINDINGS: Soft tissue defect over the tip of the third finger. Comminuted
fracture involving the tuft of the distal phalanx. Fracture fragment
is moderately displaced distally. There is mild comminution.
IMPRESSION: Fracture distal phalanx third finger

## 2020-09-21 ENCOUNTER — Other Ambulatory Visit: Payer: Self-pay

## 2020-09-22 ENCOUNTER — Encounter: Payer: Self-pay | Admitting: Nurse Practitioner

## 2020-09-22 ENCOUNTER — Ambulatory Visit (INDEPENDENT_AMBULATORY_CARE_PROVIDER_SITE_OTHER): Payer: Medicare HMO | Admitting: Nurse Practitioner

## 2020-09-22 VITALS — BP 168/72 | HR 71 | Temp 97.6°F | Resp 12 | Ht 63.0 in | Wt 109.5 lb

## 2020-09-22 DIAGNOSIS — Z780 Asymptomatic menopausal state: Secondary | ICD-10-CM | POA: Diagnosis not present

## 2020-09-22 DIAGNOSIS — I10 Essential (primary) hypertension: Secondary | ICD-10-CM | POA: Diagnosis not present

## 2020-09-22 DIAGNOSIS — Z789 Other specified health status: Secondary | ICD-10-CM

## 2020-09-22 DIAGNOSIS — Z7689 Persons encountering health services in other specified circumstances: Secondary | ICD-10-CM

## 2020-09-22 LAB — CBC
HCT: 38.6 % (ref 36.0–46.0)
Hemoglobin: 12.6 g/dL (ref 12.0–15.0)
MCHC: 32.7 g/dL (ref 30.0–36.0)
MCV: 92.1 fl (ref 78.0–100.0)
Platelets: 271 10*3/uL (ref 150.0–400.0)
RBC: 4.19 Mil/uL (ref 3.87–5.11)
RDW: 13.3 % (ref 11.5–15.5)
WBC: 5.3 10*3/uL (ref 4.0–10.5)

## 2020-09-22 LAB — COMPREHENSIVE METABOLIC PANEL
ALT: 17 U/L (ref 0–35)
AST: 20 U/L (ref 0–37)
Albumin: 4 g/dL (ref 3.5–5.2)
Alkaline Phosphatase: 67 U/L (ref 39–117)
BUN: 13 mg/dL (ref 6–23)
CO2: 31 mEq/L (ref 19–32)
Calcium: 9.2 mg/dL (ref 8.4–10.5)
Chloride: 107 mEq/L (ref 96–112)
Creatinine, Ser: 0.8 mg/dL (ref 0.40–1.20)
GFR: 77.49 mL/min (ref >60.00)
Glucose, Bld: 76 mg/dL (ref 70–99)
Potassium: 4 mEq/L (ref 3.5–5.1)
Sodium: 143 mEq/L (ref 135–145)
Total Bilirubin: 0.8 mg/dL (ref 0.2–1.2)
Total Protein: 7 g/dL (ref 6.0–8.3)

## 2020-09-22 MED ORDER — AMLODIPINE BESYLATE 5 MG PO TABS: 5.0000 mg | ORAL_TABLET | Freq: Every day | ORAL | 0 refills | Status: DC

## 2020-09-22 NOTE — Assessment & Plan Note (Signed)
Patient has had at least 2 elevated blood pressures in the past.  Recently saw her health at work coordinator when they checked her blood pressure the reading was 150s/**.  Given patient's blood pressure is elevated in office today I like to start hypertensive medications.  We will start her on amlodipine 5 mg daily.  Discussed with patient that if medication bothers her stomach she may take it with food.  Patient agreed with plan.  We will have her follow-up in 2 weeks for blood pressure check along with her CPE. Did draw basic labs today to ensure kidney function has not been affected from years of high blood pressure.

## 2020-09-22 NOTE — Assessment & Plan Note (Signed)
Patient states she likes to have 1 beer a day upon further investigation sounds like patient drinks 140 ounce beer a day.  When she drinks beer she also smokes patient states 3 cigarettes a day approximately,  for the last 30 years.

## 2020-09-22 NOTE — Progress Notes (Signed)
New Patient Office Visit  Subjective:  Patient ID: Alicia Leonard, female    DOB: 1955/05/25  Age: 65 y.o. MRN: [id number]  CC:  Chief Complaint  Patient presents with   New Patient (Initial Visit)    Re establish, used to see Dr Dayton Martes    HPI Alicia Leonard presents for establishing care with a new doctor. Last office visit was back in 2017. States that she was told that she was told that she had high blood pressure in the past and started taking HCTZ.  Per report she is stopped taking medication and has not had any symptoms not even a headache.  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  States she has taken care of her for the past 30 years.  Sister lives in [place].  And patient travels back and forth to make sure she is okay and has all the food and supplies she needs.  States she works 12-hour shifts at [employer] on the weekends.  Working shifts from 7 AM to 7 PM.  States she will do that for 3 years as she is currently on early retirement.  States she stands the entirety of her shift working on the [employer].  Patient does smoke.  States she smokes 3 cigarettes a day.  States she only smokes if she drinks.  Per report 30-year smoking history.  Patient does states she has 1 beer a day.  When questioned further it sounds to be a 40 ounce beer a day.  Past Medical History:  Diagnosis Date   Heavy menses    started age 34, had [place] for this    Past Surgical History:  Procedure Laterality Date   VAGINAL DELIVERY     WISDOM TOOTH EXTRACTION      Family History  Problem Relation Age of Onset   Anesthesia problems Neg Hx    Colon cancer Neg Hx    Esophageal cancer Neg Hx    Stomach cancer Neg Hx    Rectal cancer Neg Hx    Hypertension Mother    Stroke Father    Hypertension Father    Diabetes Father    Hypertension Sister    Hypertension Brother    Diabetes Brother    Hypertension Sister     Diabetes Sister    Hypertension Brother    Diabetes Brother     Social History   Socioeconomic History   Marital status: Married    Spouse name: Not on file   Number of children: Not on file   Years of education: Not on file   Highest education level: Not on file  Occupational History   Not on file  Tobacco Use   Smoking status: Every Day    Packs/day: 0.25    Types: Cigarettes   Smokeless tobacco: Never   Tobacco comments:    3 cigarettes  Substance and Sexual Activity   Alcohol use: Yes    Alcohol/week: 7.0 standard drinks    Types: 7 Cans of beer per week    Comment: 1 beer per day   Drug use: Yes    Types: Marijuana    Comment: 3 X day until 1986   Sexual activity: Yes    Birth control/protection: None  Other Topics Concern   Not on file  Social History Narrative   Not on file   Social Determinants of Health   Financial Resource Strain: Not on  file  Food Insecurity: Not on file  Transportation Needs: Not on file  Physical Activity: Not on file  Stress: Not on file  Social Connections: Not on file  Intimate Partner Violence: Not on file    ROS Review of Systems  Constitutional:  Negative for chills and fever.  HENT:  Negative for sore throat.   Cardiovascular:  Negative for chest pain, palpitations and leg swelling.  Gastrointestinal:  Negative for abdominal pain, diarrhea, nausea and vomiting.  Genitourinary:  Negative for frequency and menstrual problem.  Neurological:  Negative for dizziness, numbness and headaches.  Hematological:  Does not bruise/bleed easily.   Objective:   Today's Vitals: BP (!) 168/72   Pulse 71   Temp 97.6 F (36.4 C)   Resp 12   Ht 5\' 3"  (1.6 m)   Wt 109 lb 8 oz (49.7 kg)   SpO2 100%   BMI 19.40 kg/m   Physical Exam Vitals and nursing note reviewed.  Constitutional:      Appearance: Normal appearance.  HENT:     Right Ear: There is impacted cerumen.     Left Ear: There is impacted cerumen.     Ears:      Comments: Patient left prior to having irrigation will capture at next office visit.    Mouth/Throat:     Mouth: Mucous membranes are moist.     Pharynx: Oropharynx is clear.  Cardiovascular:     Rate and Rhythm: Normal rate and regular rhythm.     Pulses: Normal pulses.     Heart sounds: Normal heart sounds.  Pulmonary:     Effort: Pulmonary effort is normal.     Breath sounds: Normal breath sounds.  Abdominal:     General: Abdomen is flat. Bowel sounds are normal.  Musculoskeletal:     Cervical back: No tenderness.     Right lower leg: No edema.     Left lower leg: No edema.  Lymphadenopathy:     Cervical: No cervical adenopathy.  Skin:    General: Skin is warm.     Coloration: Skin is not jaundiced.  Neurological:     General: No focal deficit present.     Mental Status: She is alert.     Deep Tendon Reflexes: Reflexes normal.  Psychiatric:        Mood and Affect: Mood normal.    Assessment & Plan:   Problem List Items Addressed This Visit       Cardiovascular and Mediastinum   Essential hypertension - Primary    Patient has had at least 2 elevated blood pressures in the past.  Recently saw her health at work coordinator when they checked her blood pressure the reading was 150s/**.  Given patient's blood pressure is elevated in office today I like to start hypertensive medications.  We will start her on amlodipine 5 mg daily.  Discussed with patient that if medication bothers her stomach she may take it with food.  Patient agreed with plan.  We will have her follow-up in 2 weeks for blood pressure check along with her CPE. Did draw basic labs today to ensure kidney function has not been affected from years of high blood pressure.       Relevant Medications   amLODipine (NORVASC) 5 MG tablet   Other Relevant Orders   CBC   Comprehensive metabolic panel     Other   Daily consumption of alcohol    Patient states she likes to have 1 beer  a day upon further  investigation sounds like patient drinks 140 ounce beer a day.  When she drinks beer she also smokes patient states 3 cigarettes a day approximately,  for the last 30 years.       Establishing care with new doctor, encounter for    Patient has not been evaluated in approximately 5 years.  Last physician was Dr. Dayton Martes.  Electronic medical records were reviewed did go through patient's personal and family histories with patient in office today.  We will schedule patient for 2-week follow-up for blood pressure check and CPE at that time.       Menopause    Patient no longer has menses.  States her last menses was 10 years ago at the age of 56.        Outpatient Encounter Medications as of 09/22/2020  Medication Sig   [DISCONTINUED] cephALEXin (KEFLEX) 500 MG capsule Take 1 capsule (500 mg total) by mouth 4 (four) times daily.   [DISCONTINUED] hydrochlorothiazide (MICROZIDE) 12.5 MG capsule Take 1 capsule (12.5 mg total) by mouth daily.   [DISCONTINUED] HYDROcodone-acetaminophen (NORCO) 5-325 MG tablet Take 1 tablet by mouth every 6 (six) hours as needed for moderate pain.   No facility-administered encounter medications on file as of 09/22/2020.    Follow-up: Return in about 2 weeks (around 10/06/2020) for CPE and additional labs.   Audria Nine, NP

## 2020-09-22 NOTE — Assessment & Plan Note (Signed)
Patient no longer has menses.  States her last menses was 10 years ago at the age of 21.

## 2020-09-22 NOTE — Assessment & Plan Note (Signed)
Patient has not been evaluated in approximately 5 years.  Last physician was Dr. Dayton Martes.  Electronic medical records were reviewed did go through patient's personal and family histories with patient in office today.  We will schedule patient for 2-week follow-up for blood pressure check and CPE at that time.

## 2020-09-22 NOTE — Patient Instructions (Signed)
Was a pleasure meeting you today Sent the blood pressure medication to your pharmacy, pick it up and take it as directed. Follow up with me in 2 weeks for a CPE and additional lab work.

## 2020-10-06 ENCOUNTER — Other Ambulatory Visit: Payer: Self-pay

## 2020-10-06 ENCOUNTER — Encounter: Payer: Self-pay | Admitting: Nurse Practitioner

## 2020-10-06 ENCOUNTER — Ambulatory Visit (INDEPENDENT_AMBULATORY_CARE_PROVIDER_SITE_OTHER): Payer: Medicare Other | Admitting: Nurse Practitioner

## 2020-10-06 VITALS — BP 154/72 | HR 70 | Temp 97.8°F | Resp 22 | Ht 63.0 in | Wt 108.1 lb

## 2020-10-06 DIAGNOSIS — I10 Essential (primary) hypertension: Secondary | ICD-10-CM | POA: Diagnosis not present

## 2020-10-06 DIAGNOSIS — Z780 Asymptomatic menopausal state: Secondary | ICD-10-CM | POA: Diagnosis not present

## 2020-10-06 DIAGNOSIS — Z23 Encounter for immunization: Secondary | ICD-10-CM | POA: Diagnosis not present

## 2020-10-06 DIAGNOSIS — Z789 Other specified health status: Secondary | ICD-10-CM

## 2020-10-06 DIAGNOSIS — Z1211 Encounter for screening for malignant neoplasm of colon: Secondary | ICD-10-CM

## 2020-10-06 DIAGNOSIS — Z0000 Encounter for general adult medical examination without abnormal findings: Secondary | ICD-10-CM

## 2020-10-06 DIAGNOSIS — R634 Abnormal weight loss: Secondary | ICD-10-CM | POA: Insufficient documentation

## 2020-10-06 DIAGNOSIS — Z1231 Encounter for screening mammogram for malignant neoplasm of breast: Secondary | ICD-10-CM

## 2020-10-06 DIAGNOSIS — Z131 Encounter for screening for diabetes mellitus: Secondary | ICD-10-CM

## 2020-10-06 LAB — HEMOGLOBIN A1C: Hgb A1c MFr Bld: 5.9 % (ref 4.6–6.5)

## 2020-10-06 LAB — VITAMIN D 25 HYDROXY (VIT D DEFICIENCY, FRACTURES): VITD: 20.48 ng/mL — ABNORMAL LOW (ref 30.00–100.00)

## 2020-10-06 LAB — LIPID PANEL
Cholesterol: 201 mg/dL — ABNORMAL HIGH (ref 0–200)
HDL: 82.9 mg/dL (ref >39.00)
LDL Cholesterol: 89 mg/dL (ref 0–99)
NonHDL: 117.97
Total CHOL/HDL Ratio: 2
Triglycerides: 143 mg/dL (ref 0.0–149.0)
VLDL: 28.6 mg/dL (ref 0.0–40.0)

## 2020-10-06 LAB — TSH: TSH: 1.1 u[IU]/mL (ref 0.35–5.50)

## 2020-10-06 NOTE — Assessment & Plan Note (Signed)
Reviewed Medicare paperwork with patient.  Did discuss offer screening exams.  Ordered appropriate screening exams.  Patient is unsure of tetanus status.  Did administer first pneumonia vaccine of Prevnar 20 today.  Patient is interested in the Shingrix vaccines we will capture those at next office visits.  Did discuss advanced directives patient does have resource through life insurance that has ability to help create a living will.  Patient states that she has not done that yet.  Encouraged her to do so.

## 2020-10-06 NOTE — Patient Instructions (Signed)
You can take two of the 5mg  amlodipines each day until they are gone. I will have a prescription waiting for you fo rthe new 10mg  pills after you have finished the 5mg  ones.

## 2020-10-06 NOTE — Assessment & Plan Note (Signed)
Patient has been taking medication as prescribed patient denies any adverse drug events.  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  Has trended down some still 150 systolic over 70s diastolic.  She was instructed to continue checking blood pressure at home and at work and report numbers back to me.  We will titrate her medication up discontinue amlodipine 5 mg daily start amlodipine 10 mg daily.

## 2020-10-06 NOTE — Progress Notes (Signed)
Established Patient Office Visit  Subjective:  Patient ID: Alicia Leonard, female    DOB: [birth year]  Age: 65 y.o. MRN: [id number]  CC: No chief complaint on file.   HPI Alicia Leonard presents for  for complete physical and follow up of chronic conditions.  Immunizations: -Tetanus: UTD -Influenza: Never had -Covid-19: J&J -Shingles: Never had but eligible -Pneumonia: Prevnar 20 in office today  -HPV: NA  Diet: Fair diet.  Exercise: No regular exercise.  Eye exam: needs Dental exam: Completes semi-annually   Pap Smear: NA 65 Mammogram: ordered today Dexa:  ordered today Colonoscopy:  ordered today   Lung Cancer Screening: does not qualify  Hypertension: Has checked blood pressure at home and gets it checked at work. Has been in the 150s/70s. Consistent with what is in office today.   Past Medical History:  Diagnosis Date   Heavy menses    started age 63, had [place] for this    Past Surgical History:  Procedure Laterality Date   VAGINAL DELIVERY     WISDOM TOOTH EXTRACTION      Family History  Problem Relation Age of Onset   Hyperlipidemia Mother    Hypertension Mother    Heart disease Father    Stroke Father    Hypertension Father    Diabetes Father    Hypertension Sister    Hypertension Sister    Diabetes Sister    Hypertension Brother    Diabetes Brother    Hypertension Brother    Diabetes Brother    Anesthesia problems Neg Hx    Colon cancer Neg Hx    Esophageal cancer Neg Hx    Stomach cancer Neg Hx    Rectal cancer Neg Hx     Social History   Socioeconomic History   Marital status: Married    Spouse name: Not on file   Number of children: Not on file   Years of education: Not on file   Highest education level: Not on file  Occupational History   Occupation: 12 hours shifts at [employer]    Employer: [employer]    Comment: Stands a lot. Not hard labor  Tobacco Use   Smoking status: Every Day    Packs/day: 0.25     Years: 30.00    Pack years: 7.50    Types: Cigarettes   Smokeless tobacco: Never   Tobacco comments:    3 cigarettes  Substance and Sexual Activity   Alcohol use: Yes    Alcohol/week: 7.0 standard drinks    Types: 7 Cans of beer per week    Comment: 1 beer per day. Quart 40.   Drug use: Yes    Types: Marijuana    Comment: 3 X day until 1986   Sexual activity: Yes    Birth control/protection: None  Other Topics Concern   Not on file  Social History Narrative   Not on file   Social Determinants of Health   Financial Resource Strain: Not on file  Food Insecurity: Not on file  Transportation Needs: Not on file  Physical Activity: Not on file  Stress: Not on file  Social Connections: Not on file  Intimate Partner Violence: Not on file    Outpatient Medications Prior to Visit  Medication Sig Dispense Refill   amLODipine (NORVASC) 5 MG tablet Take 1 tablet (5 mg total) by mouth daily. 30 tablet 0   No facility-administered medications prior to visit.    No Known Allergies  ROS  Review of Systems  Constitutional:  Negative for chills, fatigue and fever.  Respiratory:  Negative for shortness of breath and wheezing.   Cardiovascular:  Negative for chest pain and leg swelling.  Genitourinary:  Negative for decreased urine volume, dysuria, flank pain, vaginal bleeding, vaginal discharge and vaginal pain.  Skin:  Negative for color change and pallor.  Neurological:  Negative for weakness.     Objective:    Physical Exam Vitals and nursing note reviewed.  Constitutional:      Appearance: She is normal weight.  HENT:     Right Ear: There is impacted cerumen.     Left Ear: There is impacted cerumen.  Neck:     Vascular: No carotid bruit.  Cardiovascular:     Rate and Rhythm: Normal rate and regular rhythm.  Pulmonary:     Effort: Pulmonary effort is normal.     Breath sounds: Normal breath sounds.  Abdominal:     General: Bowel sounds are normal.     Palpations:  Abdomen is soft. There is no mass.     Tenderness: There is no abdominal tenderness.  Musculoskeletal:        General: Normal range of motion.     Right lower leg: No edema.     Left lower leg: No edema.  Lymphadenopathy:     Cervical: No cervical adenopathy.  Skin:    General: Skin is warm.  Neurological:     Mental Status: She is alert.  Psychiatric:        Mood and Affect: Mood normal.        Behavior: Behavior normal.        Thought Content: Thought content normal.        Judgment: Judgment normal.    BP (!) 154/72   Pulse 70   Temp 97.8 F (36.6 C)   Resp (!) 22   Ht 5\' 3"  (1.6 m)   Wt 108 lb 1 oz (49 kg)   SpO2 99%   BMI 19.14 kg/m  Wt Readings from Last 3 Encounters:  10/06/20 108 lb 1 oz (49 kg)  09/22/20 109 lb 8 oz (49.7 kg)  06/07/15 123 lb 8 oz (56 kg)     Health Maintenance Due  Topic Date Due   COVID-19 Vaccine (1) Never done   TETANUS/TDAP  Never done   COLONOSCOPY (Pts 45-61yrs Insurance coverage will need to be confirmed)  Never done   MAMMOGRAM  Never done   Zoster Vaccines- Shingrix (1 of 2) Never done   PAP SMEAR-Modifier  02/09/2017   DEXA SCAN  Never done   PNA vac Low Risk Adult (1 of 2 - PCV13) Never done   INFLUENZA VACCINE  09/26/2020    There are no preventive care reminders to display for this patient.  Lab Results  Component Value Date   TSH 1.34 04/13/2015   Lab Results  Component Value Date   WBC 5.3 09/22/2020   HGB 12.6 09/22/2020   HCT 38.6 09/22/2020   MCV 92.1 09/22/2020   PLT 271.0 09/22/2020   Lab Results  Component Value Date   NA 143 09/22/2020   K 4.0 09/22/2020   CO2 31 09/22/2020   GLUCOSE 76 09/22/2020   BUN 13 09/22/2020   CREATININE 0.80 09/22/2020   BILITOT 0.8 09/22/2020   ALKPHOS 67 09/22/2020   AST 20 09/22/2020   ALT 17 09/22/2020   PROT 7.0 09/22/2020   ALBUMIN 4.0 09/22/2020   CALCIUM 9.2 09/22/2020  GFR 77.49 09/22/2020   Lab Results  Component Value Date   CHOL 201 (H) 04/13/2015    Lab Results  Component Value Date   HDL 73.50 04/13/2015   Lab Results  Component Value Date   LDLCALC 106 (H) 04/13/2015   Lab Results  Component Value Date   TRIG 108.0 04/13/2015   Lab Results  Component Value Date   CHOLHDL 3 04/13/2015   No results found for: HGBA1C    Assessment & Plan:   Problem List Items Addressed This Visit       Cardiovascular and Mediastinum   Essential hypertension    Patient has been taking medication as prescribed patient denies any adverse drug events.  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  Has trended down some still 150 systolic over 70s diastolic.  She was instructed to continue checking blood pressure at home and at work and report numbers back to me.  We will titrate her medication up discontinue amlodipine 5 mg daily start amlodipine 10 mg daily.        Other   Daily consumption of alcohol   Relevant Orders   Lipid panel   Post-menopause   Relevant Orders   VITAMIN D 25 Hydroxy (Vit-D Deficiency, Fractures)   DG Bone Density   Welcome to Medicare preventive visit - Primary    Reviewed Medicare paperwork with patient.  Did discuss offer screening exams.  Ordered appropriate screening exams.  Patient is unsure of tetanus status.  Did administer first pneumonia vaccine of Prevnar 20 today.  Patient is interested in the Shingrix vaccines we will capture those at next office visits.  Did discuss advanced directives patient does have resource through life insurance that has ability to help create a living will.  Patient states that she has not done that yet.  Encouraged her to do so.      Weight loss   Relevant Orders   TSH   Other Visit Diagnoses     Screening for diabetes mellitus       Relevant Orders   Hemoglobin A1c   Need for pneumococcal vaccination       Relevant Orders   Pneumococcal conjugate vaccine 20-valent (Prevnar 20)    Encounter for screening mammogram for malignant neoplasm of breast       Relevant Orders   MM Digital Screening   Screening for colon cancer       Relevant Orders   Ambulatory referral to Gastroenterology       No orders of the defined types were placed in this encounter.   Follow-up: Return in about 2 months (around 12/06/2020) for for a blood pressure check. nurse visit.    Audria Nine, NP

## 2020-10-07 ENCOUNTER — Other Ambulatory Visit: Payer: Self-pay | Admitting: Nurse Practitioner

## 2020-10-07 ENCOUNTER — Encounter: Payer: Managed Care, Other (non HMO) | Admitting: Nurse Practitioner

## 2020-10-07 DIAGNOSIS — I10 Essential (primary) hypertension: Secondary | ICD-10-CM

## 2020-10-07 MED ORDER — AMLODIPINE BESYLATE 10 MG PO TABS: 10.0000 mg | ORAL_TABLET | Freq: Every day | ORAL | 3 refills | Status: AC

## 2020-10-12 ENCOUNTER — Telehealth: Payer: Self-pay | Admitting: Nurse Practitioner

## 2020-10-12 ENCOUNTER — Other Ambulatory Visit: Payer: Self-pay | Admitting: Nurse Practitioner

## 2020-10-12 NOTE — Telephone Encounter (Signed)
Pt called to report that her BP on 8/14 was 139/61 pulse 71

## 2020-12-08 ENCOUNTER — Encounter: Payer: Self-pay | Admitting: Nurse Practitioner

## 2020-12-08 ENCOUNTER — Other Ambulatory Visit: Payer: Self-pay

## 2020-12-08 ENCOUNTER — Other Ambulatory Visit (HOSPITAL_COMMUNITY)
Admission: RE | Admit: 2020-12-08 | Discharge: 2020-12-08 | Disposition: A | Discharge status: Home / Self Care | Payer: Medicare Other | Source: Ambulatory Visit | Attending: Nurse Practitioner | Admitting: Nurse Practitioner

## 2020-12-08 ENCOUNTER — Ambulatory Visit (INDEPENDENT_AMBULATORY_CARE_PROVIDER_SITE_OTHER): Payer: Medicare Other | Admitting: Nurse Practitioner

## 2020-12-08 VITALS — BP 158/70 | HR 70 | Temp 97.4°F | Resp 12 | Ht 63.0 in | Wt 112.1 lb

## 2020-12-08 DIAGNOSIS — Z124 Encounter for screening for malignant neoplasm of cervix: Secondary | ICD-10-CM

## 2020-12-08 DIAGNOSIS — Z1151 Encounter for screening for human papillomavirus (HPV): Secondary | ICD-10-CM | POA: Insufficient documentation

## 2020-12-08 DIAGNOSIS — I10 Essential (primary) hypertension: Secondary | ICD-10-CM | POA: Diagnosis not present

## 2020-12-08 DIAGNOSIS — Z01419 Encounter for gynecological examination (general) (routine) without abnormal findings: Secondary | ICD-10-CM | POA: Diagnosis present

## 2020-12-08 NOTE — Progress Notes (Signed)
Established Patient Office Visit  Subjective:  Patient ID: Alicia Leonard, female    DOB: 1955/10/10  Age: 65 y.o. MRN: [id number]  CC:  Chief Complaint  Patient presents with   Hypertension    Follow up    HPI HAASINI PATNAUDE presents for Recheck on blood pressure and well woman exam  HTN: patient currently maintained on amlodipine 10 mg. States that when she takes it at home and work that it is with normal limits. She has called in with the number of being within normal limit. States that she checked at work 134/60 and 141/68. No symptoms  Well woman exam: did not do at her last visit. No complaints. Last pap smear was 5 years ago and was normal.  Past Medical History:  Diagnosis Date   Heavy menses    started age 50, had [place] for this    Past Surgical History:  Procedure Laterality Date   VAGINAL DELIVERY     WISDOM TOOTH EXTRACTION      Family History  Problem Relation Age of Onset   Hyperlipidemia Mother    Hypertension Mother    Heart disease Father    Stroke Father    Hypertension Father    Diabetes Father    Hypertension Sister    Hypertension Sister    Diabetes Sister    Hypertension Brother    Diabetes Brother    Hypertension Brother    Diabetes Brother    Anesthesia problems Neg Hx    Colon cancer Neg Hx    Esophageal cancer Neg Hx    Stomach cancer Neg Hx    Rectal cancer Neg Hx     Social History   Socioeconomic History   Marital status: Married    Spouse name: Not on file   Number of children: Not on file   Years of education: Not on file   Highest education level: Not on file  Occupational History   Occupation: 12 hours shifts at [employer]    Employer: [employer]    Comment: Stands a lot. Not hard labor  Tobacco Use   Smoking status: Every Day    Packs/day: 0.25    Years: 30.00    Pack years: 7.50    Types: Cigarettes   Smokeless tobacco: Never   Tobacco comments:    3 cigarettes  Substance and Sexual Activity    Alcohol use: Yes    Alcohol/week: 7.0 standard drinks    Types: 7 Cans of beer per week    Comment: 1 beer per day. Quart 40.   Drug use: Yes    Types: Marijuana    Comment: 3 X day until 1986   Sexual activity: Yes    Birth control/protection: None  Other Topics Concern   Not on file  Social History Narrative   Not on file   Social Determinants of Health   Financial Resource Strain: Not on file  Food Insecurity: Not on file  Transportation Needs: Not on file  Physical Activity: Not on file  Stress: Not on file  Social Connections: Not on file  Intimate Partner Violence: Not on file    Outpatient Medications Prior to Visit  Medication Sig Dispense Refill   amLODipine (NORVASC) 10 MG tablet Take 1 tablet (10 mg total) by mouth daily. 90 tablet 3   No facility-administered medications prior to visit.    No Known Allergies  ROS Review of Systems  Constitutional:  Negative for chills and fever.  Eyes:  Negative for visual disturbance (new glasses).  Respiratory:  Negative for cough and shortness of breath.   Cardiovascular:  Negative for chest pain and leg swelling.  Gastrointestinal:  Negative for nausea and vomiting.  Genitourinary:  Negative for vaginal bleeding, vaginal discharge and vaginal pain.     Objective:    Physical Exam Vitals and nursing note reviewed. Exam conducted with a chaperone present [facility], CMA).  Cardiovascular:     Rate and Rhythm: Normal rate and regular rhythm.  Pulmonary:     Effort: Pulmonary effort is normal.     Breath sounds: Normal breath sounds.  Abdominal:     General: Bowel sounds are normal.  Genitourinary:    General: Normal vulva.     Exam position: Lithotomy position.     Vagina: No vaginal discharge.     Cervix: No cervical motion tenderness, discharge or friability.     Uterus: Normal.      Adnexa: Right adnexa normal and left adnexa normal.  Skin:    General: Skin is warm.  Neurological:     Mental  Status: Mental status is at baseline.  Psychiatric:        Mood and Affect: Mood normal.        Behavior: Behavior normal.        Thought Content: Thought content normal.        Judgment: Judgment normal.    BP (!) 158/70   Pulse 70   Temp (!) 97.4 F (36.3 C)   Resp 12   Ht 5\' 3"  (1.6 m)   Wt 112 lb 2 oz (50.9 kg)   SpO2 97%   BMI 19.86 kg/m  Wt Readings from Last 3 Encounters:  12/08/20 112 lb 2 oz (50.9 kg)  10/06/20 108 lb 1 oz (49 kg)  09/22/20 109 lb 8 oz (49.7 kg)     Health Maintenance Due  Topic Date Due   COVID-19 Vaccine (1) Never done   TETANUS/TDAP  Never done   COLONOSCOPY (Pts 45-23yrs Insurance coverage will need to be confirmed)  Never done   MAMMOGRAM  Never done   Zoster Vaccines- Shingrix (1 of 2) Never done   PAP SMEAR-Modifier  02/09/2017   DEXA SCAN  Never done    There are no preventive care reminders to display for this patient.  Lab Results  Component Value Date   TSH 1.10 10/06/2020   Lab Results  Component Value Date   WBC 5.3 09/22/2020   HGB 12.6 09/22/2020   HCT 38.6 09/22/2020   MCV 92.1 09/22/2020   PLT 271.0 09/22/2020   Lab Results  Component Value Date   NA 143 09/22/2020   K 4.0 09/22/2020   CO2 31 09/22/2020   GLUCOSE 76 09/22/2020   BUN 13 09/22/2020   CREATININE 0.80 09/22/2020   BILITOT 0.8 09/22/2020   ALKPHOS 67 09/22/2020   AST 20 09/22/2020   ALT 17 09/22/2020   PROT 7.0 09/22/2020   ALBUMIN 4.0 09/22/2020   CALCIUM 9.2 09/22/2020   GFR 77.49 09/22/2020   Lab Results  Component Value Date   CHOL 201 (H) 10/06/2020   Lab Results  Component Value Date   HDL 82.90 10/06/2020   Lab Results  Component Value Date   LDLCALC 89 10/06/2020   Lab Results  Component Value Date   TRIG 143.0 10/06/2020   Lab Results  Component Value Date   CHOLHDL 2 10/06/2020   Lab Results  Component Value Date  HGBA1C 5.9 10/06/2020      Assessment & Plan:   Problem List Items Addressed This Visit        Cardiovascular and Mediastinum   Essential hypertension    Currently maintained on amlodipine 10 mg.  We will recheck in office blood pressure slightly above goal.  Patient's will be 150/90 given age and comorbidities.  Patient has been checking it at home and that work with occupational nurse and has been well within limits.  Will defer changing medication currently she is directed to check blood pressure every other day at home and record it along with still checking at work with the occupational nurse and we will be in touch to see what her blood pressures been running.  She is still smoking and drinking some beer did encourage patient to cut down on the amount of beer she is taking by half.  She acknowledged.        Other   Cervical cancer screening - Primary    Was not performed on her last office visit as it is unsure if she she needed a qualified given her age and last Pap smear being 5 years ago we will go ahead and do Pap smear in office along with HPV to make sure patient is okay.  She is okay with this Pap and was the one before she was no longer needing screening less than exchange.  Pending lab results      Relevant Orders   Cytology - PAP([facility])    No orders of the defined types were placed in this encounter.   Follow-up: Return in about 6 months (around 06/08/2021) for recheck.  This visit occurred during the SARS-CoV-2 public health emergency.  Safety protocols were in place, including screening questions prior to the visit, additional usage of staff PPE, and extensive cleaning of exam room while observing appropriate contact time as indicated for disinfecting solutions.     Audria Nine, NP

## 2020-12-08 NOTE — Patient Instructions (Signed)
Check your blood pressure every other day at home and even at work. Write the numbers down and we will reach out to see what they are before we decide to change your medications Also work on drink half a bottle of beer a day for me See you in 6 months, sooner if needed

## 2020-12-08 NOTE — Assessment & Plan Note (Signed)
Currently maintained on amlodipine 10 mg.  We will recheck in office blood pressure slightly above goal.  Patient's will be 150/90 given age and comorbidities.  Patient has been checking it at home and that work with occupational nurse and has been well within limits.  Will defer changing medication currently she is directed to check blood pressure every other day at home and record it along with still checking at work with the occupational nurse and we will be in touch to see what her blood pressures been running.  She is still smoking and drinking some beer did encourage patient to cut down on the amount of beer she is taking by half.  She acknowledged.

## 2020-12-08 NOTE — Assessment & Plan Note (Signed)
Was not performed on her last office visit as it is unsure if she she needed a qualified given her age and last Pap smear being 5 years ago we will go ahead and do Pap smear in office along with HPV to make sure patient is okay.  She is okay with this Pap and was the one before she was no longer needing screening less than exchange.  Pending lab results

## 2020-12-12 LAB — CYTOLOGY - PAP
Comment: NEGATIVE
Diagnosis: NEGATIVE
High risk HPV: NEGATIVE

## 2021-02-21 DIAGNOSIS — H2513 Age-related nuclear cataract, bilateral: Secondary | ICD-10-CM | POA: Diagnosis not present

## 2021-09-28 ENCOUNTER — Telehealth: Payer: Self-pay | Admitting: Nurse Practitioner

## 2021-09-28 NOTE — Telephone Encounter (Signed)
Phone not in service  Unable to leave a  message for patient to call back and schedule Medicare Annual Wellness Visit (AWV).   Please offer to do virtually or by telephone.   Last AWV:10/06/2020  Please schedule at anytime with [facility] schedule 2  45 minute appointent  If any questions, please contact me at [phone]

## 2022-01-03 ENCOUNTER — Telehealth: Payer: Self-pay | Admitting: Nurse Practitioner

## 2022-01-03 NOTE — Telephone Encounter (Signed)
No answer when calling to schedule AWV with [facility] 

## 2022-04-03 DIAGNOSIS — H2513 Age-related nuclear cataract, bilateral: Secondary | ICD-10-CM | POA: Diagnosis not present

## 2022-04-03 DIAGNOSIS — H40033 Anatomical narrow angle, bilateral: Secondary | ICD-10-CM | POA: Diagnosis not present

## 2023-01-23 LAB — FECAL OCCULT BLOOD, IMMUNOCHEMICAL: IFOBT: NEGATIVE

## 2023-02-06 ENCOUNTER — Encounter: Payer: Self-pay | Admitting: Nurse Practitioner

## 2023-05-30 ENCOUNTER — Telehealth: Payer: Self-pay

## 2023-05-30 ENCOUNTER — Encounter: Payer: Self-pay | Admitting: Nurse Practitioner

## 2023-05-30 NOTE — Telephone Encounter (Signed)
 Patient overdue for Annual visit with [facility]. Please call and schedule.

## 2023-05-30 NOTE — Telephone Encounter (Signed)
Unable to leave message, sent letter

## 2023-05-31 NOTE — Telephone Encounter (Signed)
 Contacted pt, cannot leave vm
